# Patient Record
Sex: Female | Born: 1945 | Race: White | Hispanic: No | Marital: Married | Smoking: Never smoker
Health system: Southern US, Community
[De-identification: ages and names within clinical notes are randomized; demographics above are authoritative.]

## PROBLEM LIST (undated history)

## (undated) DIAGNOSIS — M1990 Unspecified osteoarthritis, unspecified site: Secondary | ICD-10-CM

## (undated) DIAGNOSIS — E079 Disorder of thyroid, unspecified: Secondary | ICD-10-CM

## (undated) HISTORY — PX: ABDOMINAL HYSTERECTOMY: SHX81

## (undated) HISTORY — PX: ANTERIOR AND POSTERIOR VAGINAL REPAIR: SUR5

## (undated) HISTORY — PX: TUBAL LIGATION: SHX77

---

## 1998-07-15 ENCOUNTER — Ambulatory Visit (HOSPITAL_COMMUNITY): Admission: RE | Admit: 1998-07-15 | Discharge: 1998-07-15 | Payer: Self-pay | Admitting: Gastroenterology

## 1999-06-23 ENCOUNTER — Other Ambulatory Visit: Admission: RE | Admit: 1999-06-23 | Discharge: 1999-06-23 | Payer: Self-pay | Admitting: Obstetrics and Gynecology

## 2000-08-29 ENCOUNTER — Other Ambulatory Visit: Admission: RE | Admit: 2000-08-29 | Discharge: 2000-08-29 | Payer: Self-pay | Admitting: Obstetrics and Gynecology

## 2001-04-29 ENCOUNTER — Ambulatory Visit (HOSPITAL_COMMUNITY): Admission: RE | Admit: 2001-04-29 | Discharge: 2001-04-29 | Payer: Self-pay | Admitting: Gastroenterology

## 2001-10-06 ENCOUNTER — Other Ambulatory Visit: Admission: RE | Admit: 2001-10-06 | Discharge: 2001-10-06 | Payer: Self-pay | Admitting: Obstetrics and Gynecology

## 2002-10-27 ENCOUNTER — Other Ambulatory Visit: Admission: RE | Admit: 2002-10-27 | Discharge: 2002-10-27 | Payer: Self-pay | Admitting: Obstetrics and Gynecology

## 2003-12-20 ENCOUNTER — Other Ambulatory Visit: Admission: RE | Admit: 2003-12-20 | Discharge: 2003-12-20 | Payer: Self-pay | Admitting: Obstetrics and Gynecology

## 2005-01-02 ENCOUNTER — Other Ambulatory Visit: Admission: RE | Admit: 2005-01-02 | Discharge: 2005-01-02 | Payer: Self-pay | Admitting: Obstetrics and Gynecology

## 2005-10-05 ENCOUNTER — Ambulatory Visit (HOSPITAL_COMMUNITY): Admission: RE | Admit: 2005-10-05 | Discharge: 2005-10-05 | Payer: Self-pay | Admitting: Obstetrics and Gynecology

## 2006-01-21 ENCOUNTER — Other Ambulatory Visit: Admission: RE | Admit: 2006-01-21 | Discharge: 2006-01-21 | Payer: Self-pay | Admitting: Obstetrics and Gynecology

## 2006-11-25 ENCOUNTER — Inpatient Hospital Stay (HOSPITAL_COMMUNITY): Admission: RE | Admit: 2006-11-25 | Discharge: 2006-11-28 | Payer: Self-pay | Admitting: Obstetrics and Gynecology

## 2011-12-25 DIAGNOSIS — R87620 Atypical squamous cells of undetermined significance on cytologic smear of vagina (ASC-US): Secondary | ICD-10-CM | POA: Diagnosis not present

## 2011-12-25 DIAGNOSIS — Z124 Encounter for screening for malignant neoplasm of cervix: Secondary | ICD-10-CM | POA: Diagnosis not present

## 2011-12-25 DIAGNOSIS — Z1231 Encounter for screening mammogram for malignant neoplasm of breast: Secondary | ICD-10-CM | POA: Diagnosis not present

## 2011-12-31 DIAGNOSIS — M810 Age-related osteoporosis without current pathological fracture: Secondary | ICD-10-CM | POA: Diagnosis not present

## 2011-12-31 DIAGNOSIS — Z23 Encounter for immunization: Secondary | ICD-10-CM | POA: Diagnosis not present

## 2011-12-31 DIAGNOSIS — Z136 Encounter for screening for cardiovascular disorders: Secondary | ICD-10-CM | POA: Diagnosis not present

## 2011-12-31 DIAGNOSIS — Z1329 Encounter for screening for other suspected endocrine disorder: Secondary | ICD-10-CM | POA: Diagnosis not present

## 2011-12-31 DIAGNOSIS — Z0000 Encounter for general adult medical examination without abnormal findings: Secondary | ICD-10-CM | POA: Diagnosis not present

## 2012-01-14 DIAGNOSIS — R946 Abnormal results of thyroid function studies: Secondary | ICD-10-CM | POA: Diagnosis not present

## 2012-01-14 DIAGNOSIS — R7989 Other specified abnormal findings of blood chemistry: Secondary | ICD-10-CM | POA: Diagnosis not present

## 2012-01-14 DIAGNOSIS — Z1211 Encounter for screening for malignant neoplasm of colon: Secondary | ICD-10-CM | POA: Diagnosis not present

## 2012-02-18 DIAGNOSIS — M810 Age-related osteoporosis without current pathological fracture: Secondary | ICD-10-CM | POA: Diagnosis not present

## 2012-02-18 DIAGNOSIS — Z13820 Encounter for screening for osteoporosis: Secondary | ICD-10-CM | POA: Diagnosis not present

## 2012-05-12 DIAGNOSIS — H43819 Vitreous degeneration, unspecified eye: Secondary | ICD-10-CM | POA: Diagnosis not present

## 2012-05-13 DIAGNOSIS — R946 Abnormal results of thyroid function studies: Secondary | ICD-10-CM | POA: Diagnosis not present

## 2012-07-22 DIAGNOSIS — E039 Hypothyroidism, unspecified: Secondary | ICD-10-CM | POA: Diagnosis not present

## 2012-12-29 DIAGNOSIS — Z9189 Other specified personal risk factors, not elsewhere classified: Secondary | ICD-10-CM | POA: Diagnosis not present

## 2012-12-29 DIAGNOSIS — Z1231 Encounter for screening mammogram for malignant neoplasm of breast: Secondary | ICD-10-CM | POA: Diagnosis not present

## 2012-12-29 DIAGNOSIS — Z130 Encounter for screening for diseases of the blood and blood-forming organs and certain disorders involving the immune mechanism: Secondary | ICD-10-CM | POA: Diagnosis not present

## 2012-12-29 DIAGNOSIS — Z124 Encounter for screening for malignant neoplasm of cervix: Secondary | ICD-10-CM | POA: Diagnosis not present

## 2013-01-13 DIAGNOSIS — Z0000 Encounter for general adult medical examination without abnormal findings: Secondary | ICD-10-CM | POA: Diagnosis not present

## 2013-01-13 DIAGNOSIS — E039 Hypothyroidism, unspecified: Secondary | ICD-10-CM | POA: Diagnosis not present

## 2013-03-10 DIAGNOSIS — M25569 Pain in unspecified knee: Secondary | ICD-10-CM | POA: Diagnosis not present

## 2013-07-21 DIAGNOSIS — E039 Hypothyroidism, unspecified: Secondary | ICD-10-CM | POA: Diagnosis not present

## 2013-09-30 DIAGNOSIS — E039 Hypothyroidism, unspecified: Secondary | ICD-10-CM | POA: Diagnosis not present

## 2014-01-18 DIAGNOSIS — Z0000 Encounter for general adult medical examination without abnormal findings: Secondary | ICD-10-CM | POA: Diagnosis not present

## 2014-01-18 DIAGNOSIS — Z23 Encounter for immunization: Secondary | ICD-10-CM | POA: Diagnosis not present

## 2014-01-18 DIAGNOSIS — Z136 Encounter for screening for cardiovascular disorders: Secondary | ICD-10-CM | POA: Diagnosis not present

## 2014-01-18 DIAGNOSIS — E039 Hypothyroidism, unspecified: Secondary | ICD-10-CM | POA: Diagnosis not present

## 2014-02-01 DIAGNOSIS — Z124 Encounter for screening for malignant neoplasm of cervix: Secondary | ICD-10-CM | POA: Diagnosis not present

## 2014-02-01 DIAGNOSIS — Z9189 Other specified personal risk factors, not elsewhere classified: Secondary | ICD-10-CM | POA: Diagnosis not present

## 2014-02-01 DIAGNOSIS — Z1231 Encounter for screening mammogram for malignant neoplasm of breast: Secondary | ICD-10-CM | POA: Diagnosis not present

## 2014-02-01 DIAGNOSIS — Z1212 Encounter for screening for malignant neoplasm of rectum: Secondary | ICD-10-CM | POA: Diagnosis not present

## 2014-05-31 DIAGNOSIS — D126 Benign neoplasm of colon, unspecified: Secondary | ICD-10-CM | POA: Diagnosis not present

## 2014-05-31 DIAGNOSIS — Z85038 Personal history of other malignant neoplasm of large intestine: Secondary | ICD-10-CM | POA: Diagnosis not present

## 2014-05-31 DIAGNOSIS — Z800 Family history of malignant neoplasm of digestive organs: Secondary | ICD-10-CM | POA: Diagnosis not present

## 2014-05-31 DIAGNOSIS — Z8371 Family history of colonic polyps: Secondary | ICD-10-CM | POA: Diagnosis not present

## 2014-05-31 DIAGNOSIS — Z86010 Personal history of colonic polyps: Secondary | ICD-10-CM | POA: Diagnosis not present

## 2014-06-04 ENCOUNTER — Other Ambulatory Visit: Payer: Self-pay

## 2014-06-04 DIAGNOSIS — L820 Inflamed seborrheic keratosis: Secondary | ICD-10-CM | POA: Diagnosis not present

## 2014-06-04 DIAGNOSIS — D485 Neoplasm of uncertain behavior of skin: Secondary | ICD-10-CM | POA: Diagnosis not present

## 2014-06-04 DIAGNOSIS — D1801 Hemangioma of skin and subcutaneous tissue: Secondary | ICD-10-CM | POA: Diagnosis not present

## 2014-06-04 DIAGNOSIS — D235 Other benign neoplasm of skin of trunk: Secondary | ICD-10-CM | POA: Diagnosis not present

## 2014-06-04 DIAGNOSIS — L819 Disorder of pigmentation, unspecified: Secondary | ICD-10-CM | POA: Diagnosis not present

## 2014-06-14 DIAGNOSIS — H43819 Vitreous degeneration, unspecified eye: Secondary | ICD-10-CM | POA: Diagnosis not present

## 2014-07-20 DIAGNOSIS — E039 Hypothyroidism, unspecified: Secondary | ICD-10-CM | POA: Diagnosis not present

## 2014-08-27 DIAGNOSIS — Z23 Encounter for immunization: Secondary | ICD-10-CM | POA: Diagnosis not present

## 2015-01-24 DIAGNOSIS — Z13220 Encounter for screening for lipoid disorders: Secondary | ICD-10-CM | POA: Diagnosis not present

## 2015-01-24 DIAGNOSIS — H43812 Vitreous degeneration, left eye: Secondary | ICD-10-CM | POA: Diagnosis not present

## 2015-01-24 DIAGNOSIS — E039 Hypothyroidism, unspecified: Secondary | ICD-10-CM | POA: Diagnosis not present

## 2015-01-24 DIAGNOSIS — Z0000 Encounter for general adult medical examination without abnormal findings: Secondary | ICD-10-CM | POA: Diagnosis not present

## 2015-01-25 DIAGNOSIS — H43812 Vitreous degeneration, left eye: Secondary | ICD-10-CM | POA: Diagnosis not present

## 2015-02-07 DIAGNOSIS — H43812 Vitreous degeneration, left eye: Secondary | ICD-10-CM | POA: Diagnosis not present

## 2015-02-21 ENCOUNTER — Other Ambulatory Visit: Payer: Self-pay | Admitting: Obstetrics and Gynecology

## 2015-02-21 DIAGNOSIS — Z1231 Encounter for screening mammogram for malignant neoplasm of breast: Secondary | ICD-10-CM | POA: Diagnosis not present

## 2015-02-21 DIAGNOSIS — Z01419 Encounter for gynecological examination (general) (routine) without abnormal findings: Secondary | ICD-10-CM | POA: Diagnosis not present

## 2015-02-21 DIAGNOSIS — Z681 Body mass index (BMI) 19 or less, adult: Secondary | ICD-10-CM | POA: Diagnosis not present

## 2015-02-24 LAB — CYTOLOGY - PAP

## 2015-03-29 DIAGNOSIS — E039 Hypothyroidism, unspecified: Secondary | ICD-10-CM | POA: Diagnosis not present

## 2015-05-30 ENCOUNTER — Other Ambulatory Visit: Payer: Self-pay | Admitting: Obstetrics and Gynecology

## 2015-05-30 DIAGNOSIS — Z779 Other contact with and (suspected) exposures hazardous to health: Secondary | ICD-10-CM | POA: Diagnosis not present

## 2015-05-31 LAB — CYTOLOGY - PAP

## 2015-08-01 DIAGNOSIS — Z23 Encounter for immunization: Secondary | ICD-10-CM | POA: Diagnosis not present

## 2015-08-01 DIAGNOSIS — E039 Hypothyroidism, unspecified: Secondary | ICD-10-CM | POA: Diagnosis not present

## 2015-10-12 DIAGNOSIS — K625 Hemorrhage of anus and rectum: Secondary | ICD-10-CM | POA: Diagnosis not present

## 2016-03-13 DIAGNOSIS — Z1231 Encounter for screening mammogram for malignant neoplasm of breast: Secondary | ICD-10-CM | POA: Diagnosis not present

## 2019-07-09 ENCOUNTER — Other Ambulatory Visit: Payer: Self-pay | Admitting: Obstetrics and Gynecology

## 2019-07-09 DIAGNOSIS — R928 Other abnormal and inconclusive findings on diagnostic imaging of breast: Secondary | ICD-10-CM

## 2019-07-20 ENCOUNTER — Ambulatory Visit
Admission: RE | Admit: 2019-07-20 | Discharge: 2019-07-20 | Disposition: A | Discharge status: Home / Self Care | Payer: Medicare Other | Source: Ambulatory Visit | Attending: Obstetrics and Gynecology | Admitting: Obstetrics and Gynecology

## 2019-07-20 ENCOUNTER — Other Ambulatory Visit: Payer: Self-pay | Admitting: Obstetrics and Gynecology

## 2019-07-20 ENCOUNTER — Other Ambulatory Visit: Payer: Self-pay

## 2019-07-20 DIAGNOSIS — R928 Other abnormal and inconclusive findings on diagnostic imaging of breast: Secondary | ICD-10-CM

## 2019-07-20 DIAGNOSIS — N6320 Unspecified lump in the left breast, unspecified quadrant: Secondary | ICD-10-CM

## 2019-07-28 ENCOUNTER — Other Ambulatory Visit: Payer: Self-pay | Admitting: Obstetrics and Gynecology

## 2019-07-28 ENCOUNTER — Ambulatory Visit
Admission: RE | Admit: 2019-07-28 | Discharge: 2019-07-28 | Disposition: A | Discharge status: Home / Self Care | Payer: Medicare Other | Source: Ambulatory Visit | Attending: Obstetrics and Gynecology | Admitting: Obstetrics and Gynecology

## 2019-07-28 ENCOUNTER — Other Ambulatory Visit: Payer: Self-pay

## 2019-07-28 DIAGNOSIS — N6320 Unspecified lump in the left breast, unspecified quadrant: Secondary | ICD-10-CM

## 2019-08-03 ENCOUNTER — Other Ambulatory Visit: Payer: Self-pay

## 2019-08-03 ENCOUNTER — Ambulatory Visit
Admission: RE | Admit: 2019-08-03 | Discharge: 2019-08-03 | Disposition: A | Discharge status: Home / Self Care | Payer: Medicare Other | Source: Ambulatory Visit | Attending: Obstetrics and Gynecology | Admitting: Obstetrics and Gynecology

## 2019-08-03 DIAGNOSIS — N6320 Unspecified lump in the left breast, unspecified quadrant: Secondary | ICD-10-CM

## 2020-08-16 DIAGNOSIS — Z90710 Acquired absence of both cervix and uterus: Secondary | ICD-10-CM | POA: Insufficient documentation

## 2020-08-31 DIAGNOSIS — M810 Age-related osteoporosis without current pathological fracture: Secondary | ICD-10-CM | POA: Insufficient documentation

## 2020-12-02 ENCOUNTER — Other Ambulatory Visit: Payer: Self-pay

## 2020-12-02 ENCOUNTER — Emergency Department (HOSPITAL_BASED_OUTPATIENT_CLINIC_OR_DEPARTMENT_OTHER): Payer: Medicare Other

## 2020-12-02 ENCOUNTER — Encounter (HOSPITAL_BASED_OUTPATIENT_CLINIC_OR_DEPARTMENT_OTHER): Payer: Self-pay | Admitting: Emergency Medicine

## 2020-12-02 ENCOUNTER — Emergency Department (HOSPITAL_BASED_OUTPATIENT_CLINIC_OR_DEPARTMENT_OTHER)
Admission: EM | Admit: 2020-12-02 | Discharge: 2020-12-03 | Disposition: A | Discharge status: Home / Self Care | Payer: Medicare Other | Attending: Emergency Medicine | Admitting: Emergency Medicine

## 2020-12-02 DIAGNOSIS — K5904 Chronic idiopathic constipation: Secondary | ICD-10-CM

## 2020-12-02 DIAGNOSIS — S32591A Other specified fracture of right pubis, initial encounter for closed fracture: Secondary | ICD-10-CM | POA: Insufficient documentation

## 2020-12-02 DIAGNOSIS — S79911A Unspecified injury of right hip, initial encounter: Secondary | ICD-10-CM | POA: Diagnosis present

## 2020-12-02 DIAGNOSIS — W19XXXA Unspecified fall, initial encounter: Secondary | ICD-10-CM

## 2020-12-02 DIAGNOSIS — Y92410 Unspecified street and highway as the place of occurrence of the external cause: Secondary | ICD-10-CM | POA: Insufficient documentation

## 2020-12-02 DIAGNOSIS — W010XXA Fall on same level from slipping, tripping and stumbling without subsequent striking against object, initial encounter: Secondary | ICD-10-CM | POA: Insufficient documentation

## 2020-12-02 DIAGNOSIS — Y9301 Activity, walking, marching and hiking: Secondary | ICD-10-CM | POA: Diagnosis not present

## 2020-12-02 HISTORY — DX: Disorder of thyroid, unspecified: E07.9

## 2020-12-02 HISTORY — DX: Unspecified osteoarthritis, unspecified site: M19.90

## 2020-12-02 NOTE — ED Notes (Addendum)
Imaging on hold momentarily- pt brought images on a cd from earlier today.  Claiborne Billings Agricultural consultant to confirm.

## 2020-12-02 NOTE — ED Provider Notes (Signed)
[facility] Provider Note: Georgena Spurling, MD, FACEP  CSN: [id number] MRN: [id number] ARRIVAL: 12/02/20 at 2016 ROOM: [place]  Hip Injury   HISTORY OF PRESENT ILLNESS  12/02/20 11:28 PM Vicki Barker is a 75 y.o. female who was walking on the street yesterday evening and tripped over some brush she did not see.  She fell onto her right side and is having pain in her right hip and inner thigh.  She rates the pain as an 8 out of 10, worse with movement or weightbearing.  She was seen in [facility] walk-in clinic yesterday and plain films showed pubic rami fractures.  She was sent to the ED for further evaluation.   Past Medical History:  Diagnosis Date  . Arthritis   . Thyroid disease     Past Surgical History:  Procedure Laterality Date  . ABDOMINAL HYSTERECTOMY    . ANTERIOR AND POSTERIOR VAGINAL REPAIR    . TUBAL LIGATION      No family history on file.  Social History   Tobacco Use  . Smoking status: Never Smoker  . Smokeless tobacco: Never Used  Vaping Use  . Vaping Use: Never used  Substance Use Topics  . Alcohol use: Never  . Drug use: Never    Prior to Admission medications   Medication Sig Start Date End Date Taking? Authorizing Provider  levothyroxine (SYNTHROID) 50 MCG tablet Take 50 mcg by mouth daily. 09/21/20   [provider]    Allergies Penicillins and Sulfa antibiotics   REVIEW OF SYSTEMS  Negative except as noted here or in the History of Present Illness.   PHYSICAL EXAMINATION  Initial Vital Signs Blood pressure (!) 156/85, pulse 95, temperature 98.7 F (37.1 C), temperature source Oral, resp. rate 18, height 5' 3.5" (1.613 m), weight 45.8 kg, SpO2 99 %.  Examination General: Well-developed, well-nourished female in no acute distress; appearance consistent with age of record HENT: normocephalic; atraumatic Eyes: pupils equal, round and reactive to light; extraocular muscles intact Neck: supple;  nontender Heart: regular rate and rhythm Lungs: clear to auscultation bilaterally Abdomen: soft; nondistended; right pubic bone tenderness; bowel sounds present Extremities: No deformity; full range of motion; pulses normal Neurologic: Awake, alert and oriented; motor function intact in all extremities and symmetric; no facial droop Skin: Warm and dry Psychiatric: Normal mood and affect   RESULTS  Summary of this visit's results, reviewed and interpreted by myself:   EKG Interpretation  Date/Time:    Ventricular Rate:    PR Interval:    QRS Duration:   QT Interval:    QTC Calculation:   R Axis:     Text Interpretation:        Laboratory Studies: No results found for this or any previous visit (from the past 24 hour(s)). Imaging Studies: CT PELVIS WO CONTRAST  Result Date: 12/02/2020 CLINICAL DATA:  Pelvic pain.  Pain in the inner right thigh. EXAM: CT PELVIS WITHOUT CONTRAST TECHNIQUE: Multidetector CT imaging of the pelvis was performed following the standard protocol without intravenous contrast. COMPARISON:  None. FINDINGS: Urinary Tract:  The urinary bladder is significantly distended. Bowel: There is a large amount of stool throughout the colon. There appears to be fecalization of multiple small bowel loops. Vascular/Lymphatic: Atherosclerotic changes are noted of the distal abdominal aorta. There are no definite pathologically enlarged lymph nodes. Reproductive: No mass or other significant abnormality the patient appears to be status post prior hysterectomy. Other: There is soft  tissue swelling overlying the right hip laterally. Musculoskeletal: There is acute nondisplaced fracture of the right inferior pubic ramus. There is an acute nondisplaced fracture of the parasymphyseal right pubic bone. There is no evidence for right hip fracture. There are degenerative changes of the lumbar spine. IMPRESSION: 1. Acute nondisplaced fractures of the right inferior pubic ramus and  parasymphyseal right pubic bone. 2. No evidence for right hip fracture. 3. Soft tissue swelling overlying the right hip laterally. 4. Large amount of stool throughout the colon with fecalization of multiple small bowel loops. Findings are suggestive of constipation. 5. Distended urinary bladder. Aortic Atherosclerosis (ICD10-I70.0). Electronically Signed   By: Constance Holster M.D.   On: 12/02/2020 23:40    ED COURSE and MDM  Nursing notes, initial and subsequent vitals signs, including pulse oximetry, reviewed and interpreted by myself.  Vitals:   12/02/20 2027 12/02/20 2028 12/02/20 2300 12/02/20 2318  BP: (!) 177/85  (!) 168/84 (!) 156/85  Pulse: (!) 108  (!) 104 95  Resp: 20  20 18   Temp: 98.7 F (37.1 C)  98.7 F (37.1 C)   TempSrc: Oral  Oral   SpO2: 100%  100% 99%  Weight:  45.8 kg    Height:  5' 3.5" (1.613 m)     Medications - No data to display  The patient's fractures do not involve her right hip or acetabulum.  I believe it is safe for her to be weightbearing as tolerated.  She was advised she will likely need a walker which she can get from a medical supply store.  We will refer to orthopedics for long-term management.  The patient is aware of constipation and if she is on fiber supplements for this.  She does not wish to take any pain medications other than Tylenol.  PROCEDURES  Procedures   ED DIAGNOSES     ICD-10-CM   1. Inferior pubic ramus fracture, right, closed, initial encounter ([place])  S32.591A   2. Fall, initial encounter  W19.XXXA   3. Chronic idiopathic constipation  K59.04        Traci Gafford, Jenny Reichmann, MD 12/03/20 0006

## 2020-12-02 NOTE — ED Triage Notes (Signed)
Pt was walking last night and did not see some brush on road. Tripped and fell on street. Seen at [facility] walk in, has paperwork and DVD. Pain in inner thigh right leg

## 2020-12-03 MED ORDER — ACETAMINOPHEN-CODEINE #3 300-30 MG PO TABS: 1.0000 | ORAL_TABLET | ORAL | 0 refills | Status: DC | PRN

## 2020-12-27 IMAGING — MG MM DIGITAL DIAGNOSTIC UNILAT*L* W/ TOMO W/ CAD
8 series · 9 of 24 positions shown · non-contrast
Comparison: Previous exam(s).

CLINICAL DATA: Patient recalled from screening for left breast
asymmetry.

EXAM:
DIGITAL DIAGNOSTIC LEFT MAMMOGRAM WITH CAD AND TOMO
ULTRASOUND LEFT BREAST

[L CC synth-2D (1 of 2)]
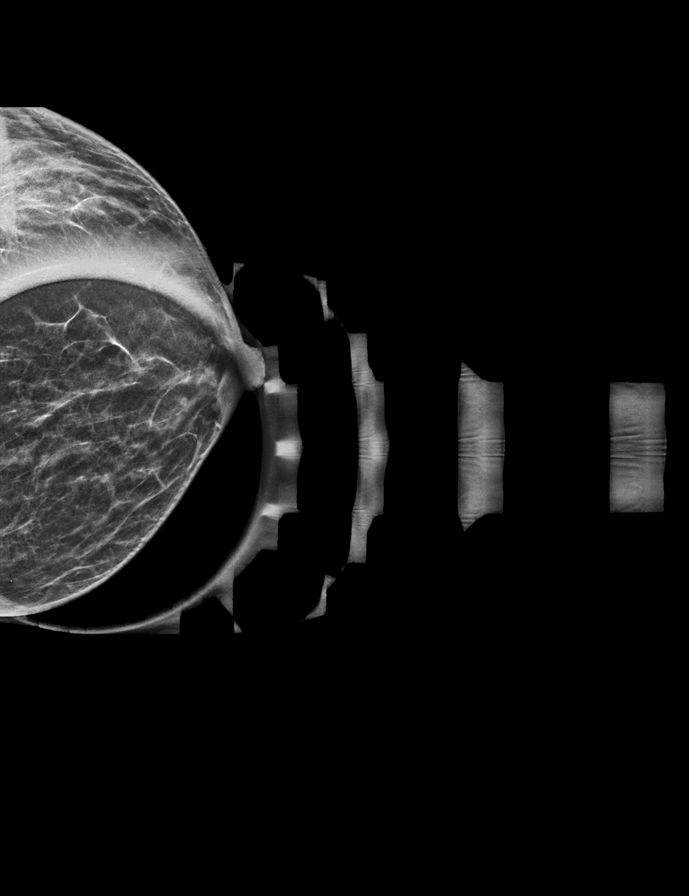

[L ML synth-2D]
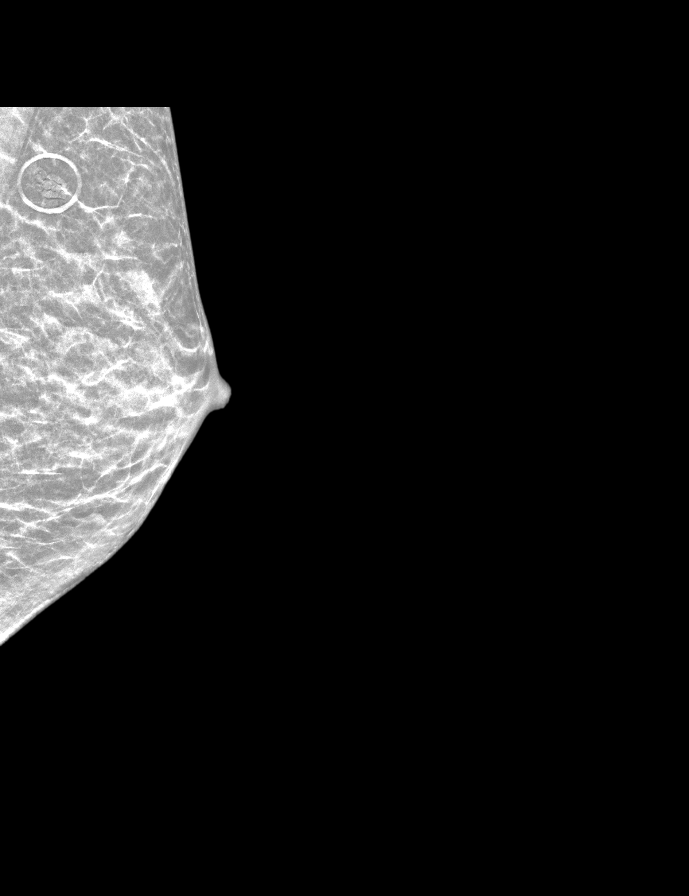

[L CC synth-2D (2 of 2)]
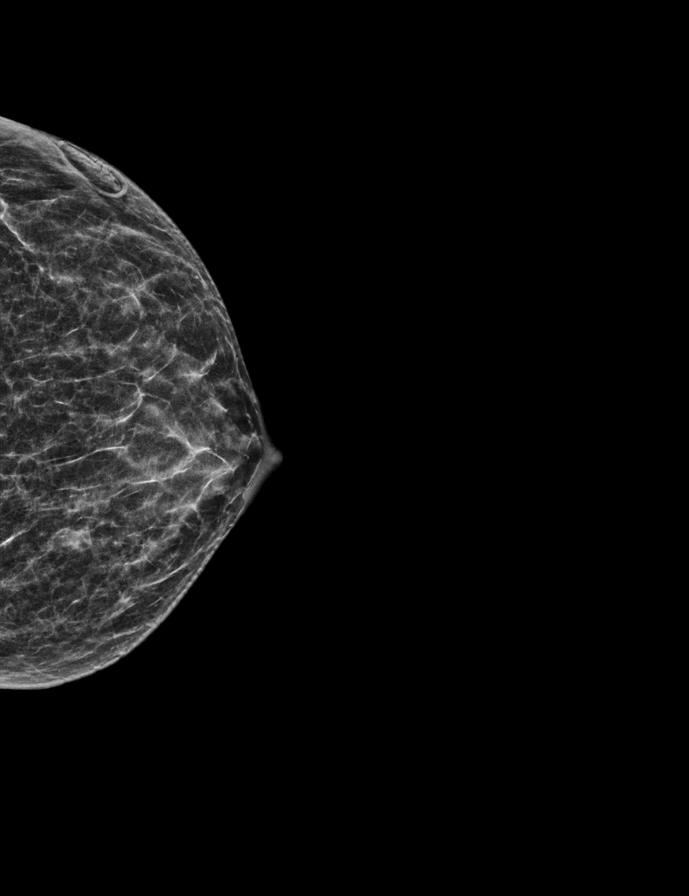

[L MLO synth-2D]
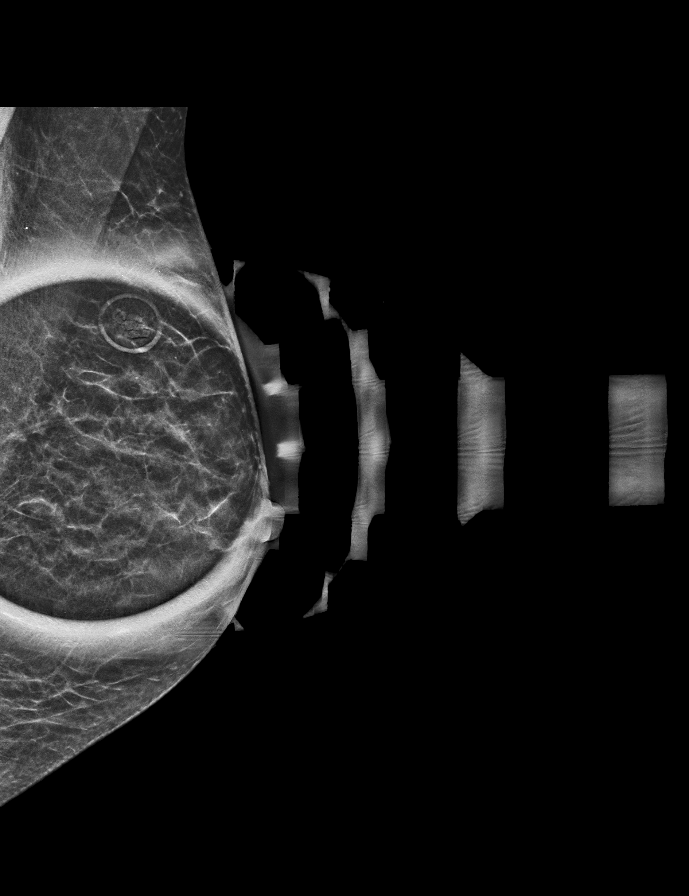

[L ML tomo · 2 of 36 frames shown]
[frame 12/36]
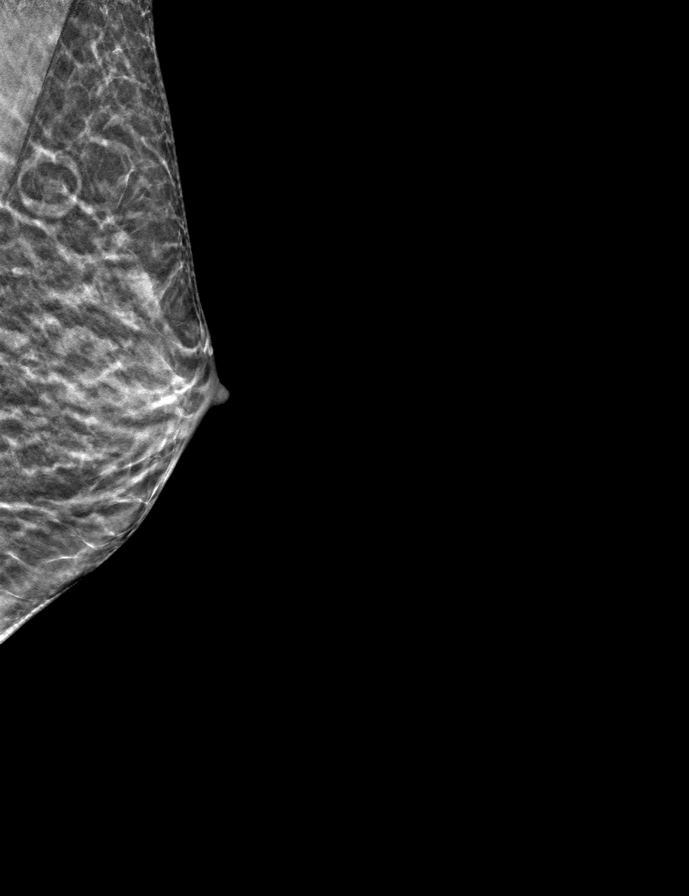
[frame 19/36]
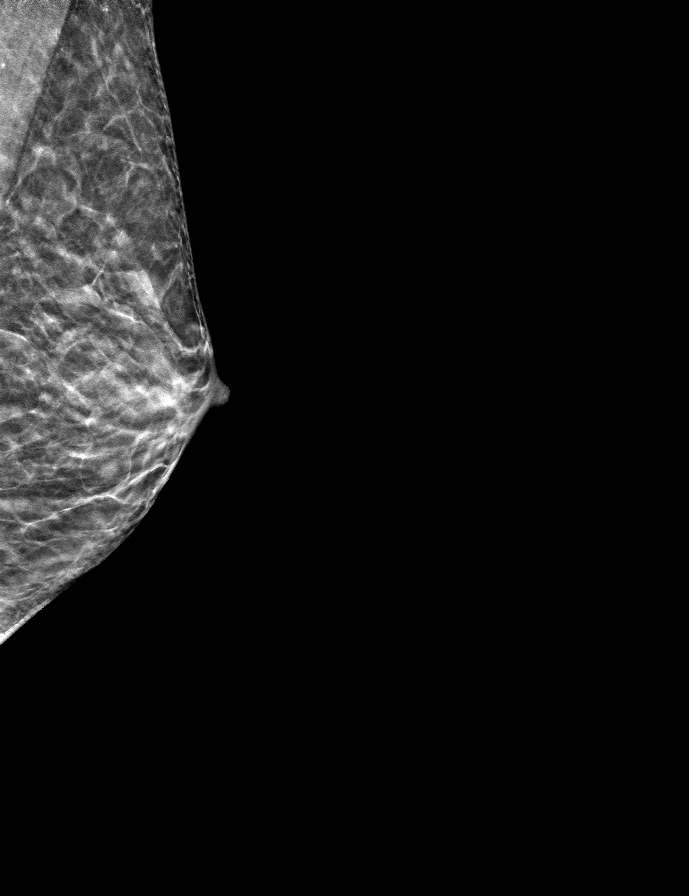

[L MLO tomo · tomo slice 19/37.0]
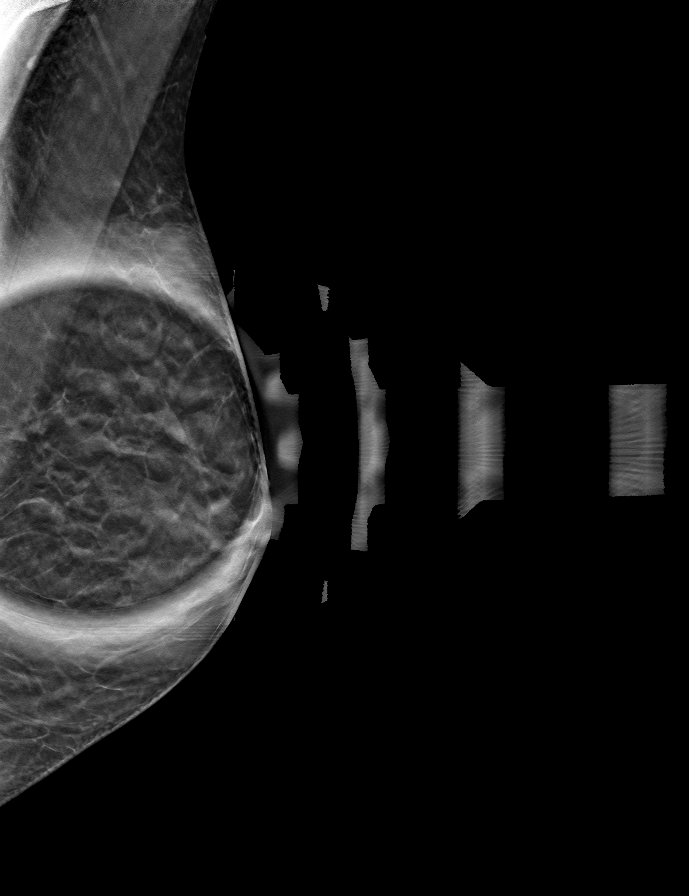

[L CC tomo (1 of 2) · tomo slice 20/39.0]
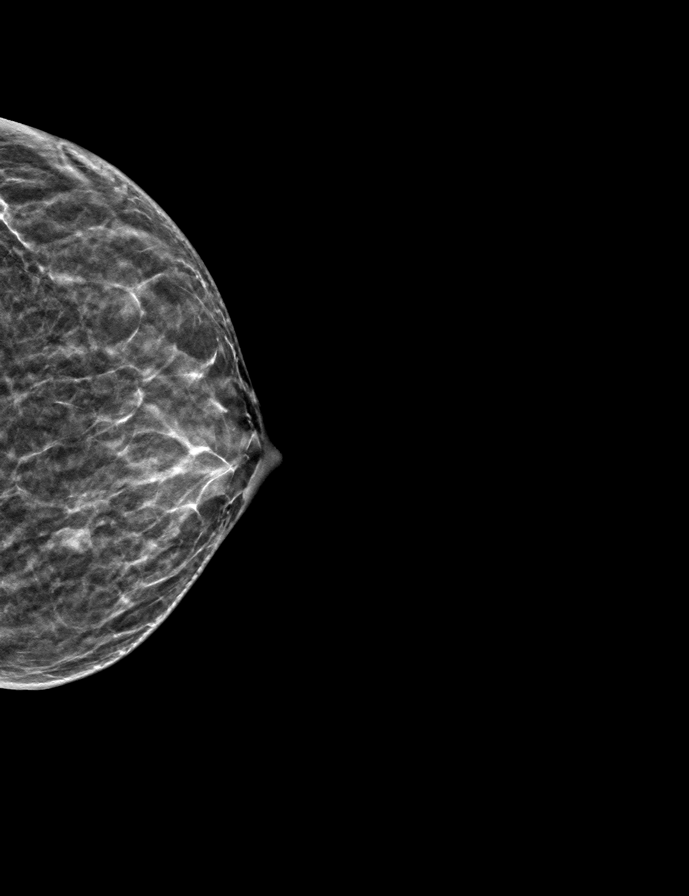

[L CC tomo (2 of 2) · tomo slice 19/38.0]
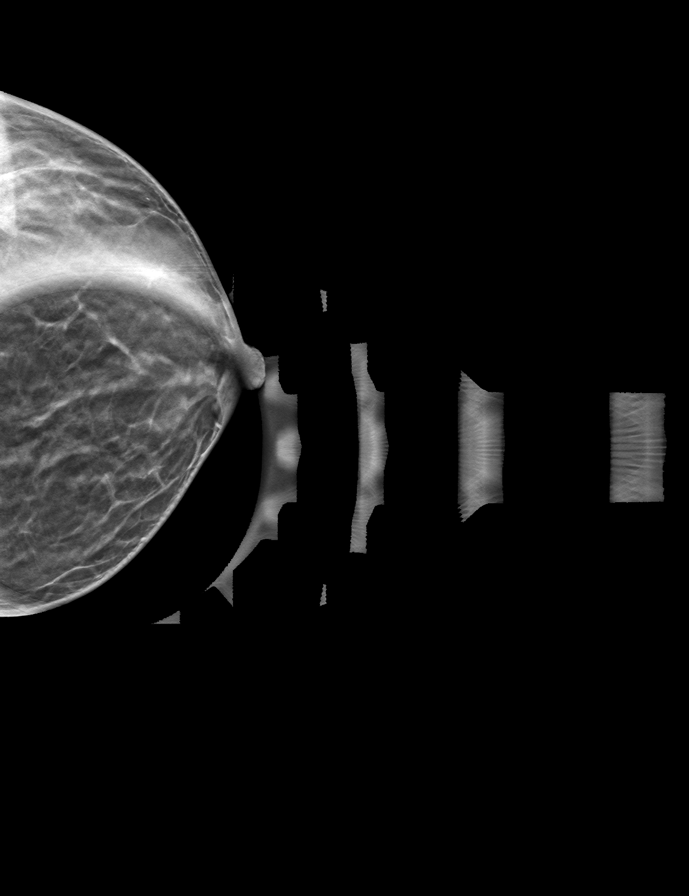

[9 of 24 positions shown; findings below may reference images not displayed]

ACR Breast Density Category c: The breast tissue is heterogeneously
dense, which may obscure small masses.
FINDINGS: There is a persistent asymmetry demonstrated within the medial
aspect of the left breast middle depth, demonstrated best on the CC
view.

Mammographic images were processed with CAD.

On physical exam, no discrete mass is palpated within the medial
left breast.

Targeted ultrasound is performed, showing a 6 x 3 x 7 mm lobular
hypoechoic solid and cystic mass left breast 10 o'clock position 2
cm from nipple. No left axillary adenopathy.
IMPRESSION: Indeterminate solid and cystic mass left breast 10 o'clock position,
felt to correspond with mammographically identified asymmetry.

RECOMMENDATION:
Ultrasound-guided core needle biopsy left breast mass 10 o'clock
position.

I have discussed the findings and recommendations with the patient.
Results were also provided in writing at the conclusion of the
visit. If applicable, a reminder letter will be sent to the patient
regarding the next appointment.

BI-RADS CATEGORY  4: Suspicious.

## 2022-01-17 DIAGNOSIS — E039 Hypothyroidism, unspecified: Secondary | ICD-10-CM | POA: Insufficient documentation

## 2022-01-17 DIAGNOSIS — Z860101 Personal history of adenomatous and serrated colon polyps: Secondary | ICD-10-CM | POA: Insufficient documentation

## 2024-05-28 ENCOUNTER — Encounter: Payer: Self-pay | Admitting: Oncology

## 2024-05-28 ENCOUNTER — Inpatient Hospital Stay: Attending: Oncology | Admitting: Oncology

## 2024-05-28 ENCOUNTER — Inpatient Hospital Stay

## 2024-05-28 VITALS — BP 135/71 | HR 91 | Temp 98.7°F | Resp 17 | Ht 62.0 in | Wt 99.7 lb

## 2024-05-28 DIAGNOSIS — M810 Age-related osteoporosis without current pathological fracture: Secondary | ICD-10-CM | POA: Insufficient documentation

## 2024-05-28 DIAGNOSIS — D709 Neutropenia, unspecified: Secondary | ICD-10-CM

## 2024-05-28 DIAGNOSIS — Z880 Allergy status to penicillin: Secondary | ICD-10-CM | POA: Diagnosis not present

## 2024-05-28 DIAGNOSIS — Z882 Allergy status to sulfonamides status: Secondary | ICD-10-CM | POA: Diagnosis not present

## 2024-05-28 DIAGNOSIS — Z79890 Hormone replacement therapy (postmenopausal): Secondary | ICD-10-CM | POA: Insufficient documentation

## 2024-05-28 DIAGNOSIS — M19049 Primary osteoarthritis, unspecified hand: Secondary | ICD-10-CM | POA: Insufficient documentation

## 2024-05-28 DIAGNOSIS — D696 Thrombocytopenia, unspecified: Secondary | ICD-10-CM

## 2024-05-28 DIAGNOSIS — E039 Hypothyroidism, unspecified: Secondary | ICD-10-CM | POA: Diagnosis not present

## 2024-05-28 DIAGNOSIS — M1990 Unspecified osteoarthritis, unspecified site: Secondary | ICD-10-CM

## 2024-05-28 LAB — CMP (CANCER CENTER ONLY)
ALT: 10 U/L (ref 0–44)
AST: 13 U/L — ABNORMAL LOW (ref 15–41)
Albumin: 4.2 g/dL (ref 3.5–5.0)
Alkaline Phosphatase: 59 U/L (ref 38–126)
Anion gap: 3 — ABNORMAL LOW (ref 5–15)
BUN: 15 mg/dL (ref 8–23)
CO2: 33 mmol/L — ABNORMAL HIGH (ref 22–32)
Calcium: 9.8 mg/dL (ref 8.9–10.3)
Chloride: 102 mmol/L (ref 98–111)
Creatinine: 0.81 mg/dL (ref 0.44–1.00)
GFR, Estimated: >60 mL/min (ref >60)
Glucose, Bld: 117 mg/dL — ABNORMAL HIGH (ref 70–99)
Potassium: 5 mmol/L (ref 3.5–5.1)
Sodium: 138 mmol/L (ref 135–145)
Total Bilirubin: 0.4 mg/dL (ref 0.0–1.2)
Total Protein: 6.9 g/dL (ref 6.5–8.1)

## 2024-05-28 LAB — CBC WITH DIFFERENTIAL (CANCER CENTER ONLY)
Abs Immature Granulocytes: 0.01 K/uL (ref 0.00–0.07)
Basophils Absolute: 0 K/uL (ref 0.0–0.1)
Basophils Relative: 0 %
Eosinophils Absolute: 0 K/uL (ref 0.0–0.5)
Eosinophils Relative: 0 %
HCT: 37 % (ref 36.0–46.0)
Hemoglobin: 12.5 g/dL (ref 12.0–15.0)
Immature Granulocytes: 0 %
Lymphocytes Relative: 39 %
Lymphs Abs: 1.3 K/uL (ref 0.7–4.0)
MCH: 30.3 pg (ref 26.0–34.0)
MCHC: 33.8 g/dL (ref 30.0–36.0)
MCV: 89.6 fL (ref 80.0–100.0)
Monocytes Absolute: 0.8 K/uL (ref 0.1–1.0)
Monocytes Relative: 24 %
Neutro Abs: 1.2 K/uL — ABNORMAL LOW (ref 1.7–7.7)
Neutrophils Relative %: 37 %
Platelet Count: 113 K/uL — ABNORMAL LOW (ref 150–400)
RBC: 4.13 MIL/uL (ref 3.87–5.11)
RDW: 13.9 % (ref 11.5–15.5)
WBC Count: 3.3 K/uL — ABNORMAL LOW (ref 4.0–10.5)
nRBC: 0 % (ref 0.0–0.2)

## 2024-05-28 LAB — IMMATURE PLATELET FRACTION: Immature Platelet Fraction: 9.5 % — ABNORMAL HIGH (ref 1.2–8.6)

## 2024-05-28 LAB — FOLATE: Folate: 28.1 ng/mL (ref >5.9)

## 2024-05-28 LAB — VITAMIN B12: Vitamin B-12: 718 pg/mL (ref 180–914)

## 2024-05-28 LAB — LACTATE DEHYDROGENASE: LDH: 185 U/L (ref 98–192)

## 2024-05-28 NOTE — Assessment & Plan Note (Addendum)
 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 Neutrophil count remains at a safe level of 1,100, indicating preserved immunity. Differential diagnosis includes chronic leukemia, myelodysplastic syndrome (MDS), and autoimmune destruction of cells. Low suspicion for leukemia, MDS is a possibility but considered slim. No symptoms of infection or bleeding, suggesting adequate cell function.  Labs today revealed white count of 3300 with ANC of 1200, ALC of 1300, normal differential.  Morphology unremarkable.  Hemoglobin 12.5, platelet 113,000.  CMP unremarkable.  B12, folate are within normal limits.  LDH normal.  Immature platelet fraction increased at 9.5%, indicating adequate bone marrow response for thrombocytopenia.  Iron studies, copper , ANA, rheumatoid factor, flow cytometry of peripheral blood, BCR/ABL 1 testing was pursued.  We will follow-up on the results and discuss with patient, once results available.  - Arrange a phone call visit next week to discuss results. - Plan to see her again in two months to monitor blood counts.

## 2024-05-28 NOTE — Progress Notes (Signed)
 [facility]  HEMATOLOGY CLINIC CONSULTATION NOTE   PATIENT NAME: Vicki Barker   MR#: [id number] DOB: August 05, 1946  DATE OF SERVICE: 05/28/2024   REFERRING PROVIDER  Rolinda, Vernell, MD   Patient Care Team: Rolinda Vernell, MD as PCP - General (Family Medicine)   REASON FOR CONSULTATION/ CHIEF COMPLAINT:  Leukopenia and thrombocytopenia  ASSESSMENT & PLAN:  Vicki Barker is a 78 y.o. lady with a past medical history of osteoporosis, hypothyroidism, was referred to our service for evaluation of leukopenia and thrombocytopenia.    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 Neutrophil count remains at a safe level of 1,100, indicating preserved immunity. Differential diagnosis includes chronic leukemia, myelodysplastic syndrome (MDS), and autoimmune destruction of cells. Low suspicion for leukemia, MDS is a possibility but considered slim. No symptoms of infection or bleeding, suggesting adequate cell function.  Labs today revealed white count of 3300 with ANC of 1200, ALC of 1300, normal differential.  Morphology unremarkable.  Hemoglobin 12.5, platelet 113,000.  CMP unremarkable.  B12, folate are within normal limits.  LDH normal.  Immature platelet fraction increased at 9.5%, indicating adequate bone marrow response for thrombocytopenia.  Iron studies, copper , ANA, rheumatoid factor, flow cytometry of peripheral blood, BCR/ABL 1 testing was pursued.  We will follow-up on the results and discuss with patient, once results available.  - Arrange a phone call visit next week to discuss results. - Plan to see her again in two months to monitor blood counts.  Thrombocytopenia (HCC) Chronic thrombocytopenia with a platelet count of 100, decreased from 144 in May 2020. No symptoms of bleeding such as epistaxis or gum bleeding. Differential diagnosis includes autoimmune destruction of platelets and MDS.  Platelet count is above the threshold for treatment with steroids, which would be considered if platelets fall below 30,000.  - Monitor platelet count and assess for any signs of bleeding.  - Platelet count stable at 113,000 today.  Immature platelet fraction is increased at 9.5%, indicating adequate bone marrow response to thrombocytopenia.   Osteoporosis Osteoporosis managed with Prolia for the past five to six years. Last injection was in May.  Hypothyroidism Hypothyroidism managed with thyroid  medication. Recent blood work showed improved thyroid  function compared to last year.  Arthritis Mild arthritis with occasional pain in fingers, not requiring regular medication. No significant impact on daily activities.  Follow-up Plan to follow up on blood test results and monitor her condition over time. - Arrange a phone call visit next week to discuss blood test results. - Schedule an in-person follow-up appointment in two months.  I reviewed lab results and outside records for this visit and discussed relevant results with the patient. Diagnosis, plan of care and treatment options were also discussed in detail with the patient. Opportunity provided to ask questions and answers provided to her apparent satisfaction. Provided instructions to call our clinic with any problems, questions or concerns prior to return visit. I recommended to continue follow-up with PCP and sub-specialists. She verbalized understanding and agreed with the plan. No barriers to learning was detected.  [name], MD  05/28/2024 2:19 PM  [facility] - A DEPT OF [facility] [address] [place] [address] Dept: [phone] Dept Fax: [phone]   HISTORY OF PRESENT ILLNESS:  Discussed the use of AI scribe software for clinical note transcription with the patient, who  gave verbal consent to proceed.  History of Present Illness Vicki Barker is a 78 year old female who presents with low white blood cell and platelet counts.  Labs at her PCPs office on 05/12/2024 showed white count of 2900 with ANC of 1000, ALC of 1300, neutrophils 36%, lymphocytes 44%, monocytes 19%.  Hemoglobin was 13, MCV 88.4.  Platelet count was 100,000.  She was referred to us  for further evaluation of leukopenia and thrombocytopenia.   On review of records from an outside facility, labs in May 2020 showed white count of 3300 with ANC of 1100, ALC of 1400.  Platelet count was slightly low at 144,000 at that time.  Hemoglobin was normal at 12.8.  She is currently taking thyroid  medication and vitamin D supplements. She has been receiving Prolia injections for the past five or six years, with the last injection in May. She has not started any new medications recently and does not take any over-the-counter supplements or plant-based medicines. No history of blood thinner use.  No recurrent infections, fevers, chills, or night sweats. She has not had a cold in a long time and has never been symptomatic for COVID-19. No bleeding problems such as epistaxis or gum bleeds. Her red blood cell count and hemoglobin levels have been normal. She believes her iron levels are also normal based on previous blood work results.  Her cholesterol and thyroid  function, which were elevated last year, have improved this year. 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 She works part-time at [employer] and reports having plenty of energy and not feeling tired.    She denies fever, cough, diarrhea, or other infectious symptoms.  She denies epistaxis, bloody stool, melena, hematuria, bruising or other bleeding symptoms. She also denies unintentional weight loss, night sweats or other constitutional symptoms.  MEDICAL HISTORY Past Medical History:  Diagnosis Date   Arthritis    History of  laparoscopic-assisted vaginal hysterectomy 08/16/2020   Thyroid  disease      SURGICAL HISTORY Past Surgical History:  Procedure Laterality Date   ABDOMINAL HYSTERECTOMY     ANTERIOR AND POSTERIOR VAGINAL REPAIR     TUBAL LIGATION       SOCIAL HISTORY: She reports that she has never smoked. She has never used smokeless tobacco. She reports that she does not drink alcohol and does not use drugs. Social History   Socioeconomic History   Marital status: Married    Spouse name: Not on file   Number of children: Not on file   Years of education: Not on file   Highest education level: Not on file  Occupational History   Not on file  Tobacco Use   Smoking status: Never   Smokeless tobacco: Never  Vaping Use   Vaping status: Never Used  Substance and Sexual Activity   Alcohol use: Never   Drug use: Never   Sexual activity: Not on file  Other Topics Concern   Not on file  Social History Narrative   Not on file   Social Drivers of Health   Financial Resource Strain: Not on file  Food Insecurity: No Food Insecurity (05/28/2024)   Hunger Vital Sign    Worried About Running Out of Food in the Last Year: Never true    Ran Out of Food in the Last Year: Never true  Transportation Needs: No Transportation Needs (05/28/2024)   PRAPARE - Transportation  Lack of Transportation (Medical): No    Lack of Transportation (Non-Medical): No  Physical Activity: Not on file  Stress: Not on file  Social Connections: Not on file  Intimate Partner Violence: Not At Risk (05/28/2024)   Humiliation, Afraid, Rape, and Kick questionnaire    Fear of Current or Ex-Partner: No    Emotionally Abused: No    Physically Abused: No    Sexually Abused: No    FAMILY HISTORY: Her family history is not on file.  CURRENT MEDICATIONS   Current Outpatient Medications  Medication Instructions   Ergocalciferol 10 MCG (400 UNIT) TABS 1 tablet, Daily   levothyroxine (SYNTHROID) 75 mcg, Daily before  breakfast   Lutein-Zeaxanthin 25-5 MG CAPS 1 tablet, Daily   Prolia 60 mg, Every 6 months     ALLERGIES  She is allergic to penicillins and sulfa antibiotics.  REVIEW OF SYSTEMS:  Review of Systems - Oncology   Rest of the pertinent review of systems is unremarkable except as mentioned above in HPI.  PHYSICAL EXAMINATION:    Onc Performance Status - 05/28/24 0858       ECOG Perf Status   ECOG Perf Status Fully active, able to carry on all pre-disease performance without restriction      KPS SCALE   KPS % SCORE Normal, no compliants, no evidence of disease          Vitals:   05/28/24 0847 05/28/24 0848  BP: (!) 155/71 135/71  Pulse: 91   Resp: 17   Temp: 98.7 F (37.1 C)   SpO2: 99%    Filed Weights   05/28/24 0847  Weight: 99 lb 11.2 oz (45.2 kg)    Physical Exam Constitutional:      General: She is not in acute distress.    Appearance: Normal appearance.  HENT:     Head: Normocephalic and atraumatic.  Cardiovascular:     Rate and Rhythm: Normal rate.     Heart sounds: Normal heart sounds.  Pulmonary:     Effort: Pulmonary effort is normal. No respiratory distress.     Breath sounds: Normal breath sounds.  Abdominal:     General: There is no distension.     Palpations: Abdomen is soft. There is no mass.  Lymphadenopathy:     Cervical: No cervical adenopathy.  Neurological:     General: No focal deficit present.     Mental Status: She is alert and oriented to person, place, and time.  Psychiatric:        Mood and Affect: Mood normal.        Behavior: Behavior normal.      LABORATORY DATA:   I have reviewed the data as listed.  Results for orders placed or performed in visit on 05/28/24  Lactate dehydrogenase  Result Value Ref Range   LDH 185 98 - 192 U/L  Immature Platelet Fraction  Result Value Ref Range   Immature Platelet Fraction 9.5 (H) 1.2 - 8.6 %  Vitamin B12  Result Value Ref Range   Vitamin B-12 718 180 - 914 pg/mL  Folate   Result Value Ref Range   Folate 28.1 >5.9 ng/mL  CMP (Cancer Center only)  Result Value Ref Range   Sodium 138 135 - 145 mmol/L   Potassium 5.0 3.5 - 5.1 mmol/L   Chloride 102 98 - 111 mmol/L   CO2 33 (H) 22 - 32 mmol/L   Glucose, Bld 117 (H) 70 - 99 mg/dL   BUN 15 8 -  23 mg/dL   Creatinine 9.18 9.55 - 1.00 mg/dL   Calcium 9.8 8.9 - 89.6 mg/dL   Total Protein 6.9 6.5 - 8.1 g/dL   Albumin 4.2 3.5 - 5.0 g/dL   AST 13 (L) 15 - 41 U/L   ALT 10 0 - 44 U/L   Alkaline Phosphatase 59 38 - 126 U/L   Total Bilirubin 0.4 0.0 - 1.2 mg/dL   GFR, Estimated >39 >39 mL/min   Anion gap 3 (L) 5 - 15  CBC with Differential (Cancer Center Only)  Result Value Ref Range   WBC Count 3.3 (L) 4.0 - 10.5 K/uL   RBC 4.13 3.87 - 5.11 MIL/uL   Hemoglobin 12.5 12.0 - 15.0 g/dL   HCT 62.9 63.9 - 53.9 %   MCV 89.6 80.0 - 100.0 fL   MCH 30.3 26.0 - 34.0 pg   MCHC 33.8 30.0 - 36.0 g/dL   RDW 86.0 88.4 - 84.4 %   Platelet Count 113 (L) 150 - 400 K/uL   nRBC 0.0 0.0 - 0.2 %   Neutrophils Relative % 37 %   Neutro Abs 1.2 (L) 1.7 - 7.7 K/uL   Lymphocytes Relative 39 %   Lymphs Abs 1.3 0.7 - 4.0 K/uL   Monocytes Relative 24 %   Monocytes Absolute 0.8 0.1 - 1.0 K/uL   Eosinophils Relative 0 %   Eosinophils Absolute 0.0 0.0 - 0.5 K/uL   Basophils Relative 0 %   Basophils Absolute 0.0 0.0 - 0.1 K/uL   WBC Morphology MORPHOLOGY UNREMARKABLE    RBC Morphology MORPHOLOGY UNREMARKABLE    Smear Review LARGE AND GIANT PLTS    Immature Granulocytes 0 %   Abs Immature Granulocytes 0.01 0.00 - 0.07 K/uL     RADIOGRAPHIC STUDIES:  No pertinent imaging studies available to review.  Orders Placed This Encounter  Procedures   CBC with Differential (Cancer Center Only)    Standing Status:   Future    Number of Occurrences:   1    Expiration Date:   05/28/2025   CMP (Cancer Center only)    Standing Status:   Future    Number of Occurrences:   1    Expiration Date:   05/28/2025   Flow Cytometry, Peripheral  Blood (Oncology)    Standing Status:   Future    Number of Occurrences:   1    Expiration Date:   05/28/2025   Folate    Standing Status:   Future    Number of Occurrences:   1    Expiration Date:   05/28/2025   Vitamin B12    Standing Status:   Future    Number of Occurrences:   1    Expiration Date:   05/28/2025   Methylmalonic acid, serum    Standing Status:   Future    Number of Occurrences:   1    Expiration Date:   05/28/2025   ANA w/Reflex if Positive    Standing Status:   Future    Number of Occurrences:   1    Expiration Date:   05/28/2025   Rheumatoid factor    Standing Status:   Future    Number of Occurrences:   1    Expiration Date:   05/28/2025   Immature Platelet Fraction    Standing Status:   Future    Number of Occurrences:   1    Expiration Date:   05/28/2025   Lactate dehydrogenase  Standing Status:   Future    Number of Occurrences:   1    Expiration Date:   05/28/2025   Copper , serum    Standing Status:   Future    Number of Occurrences:   1    Expiration Date:   05/28/2025   BCR-ABL1 FISH    Standing Status:   Future    Number of Occurrences:   1    Expiration Date:   05/28/2025    Future Appointments  Date Time Provider Department Center  06/04/2024 12:00 PM Johnte Portnoy, Chinita, MD [facility] None  07/30/2024  8:15 AM [facility] LAB [facility] None  07/30/2024  8:45 AM Deavon Podgorski, MD [facility] None    I spent a total of 55 minutes during this encounter with the patient including review of chart and various tests results, discussions about plan of care and coordination of care plan.  This document was completed utilizing speech recognition software. Grammatical errors, random word insertions, pronoun errors, and incomplete sentences are an occasional consequence of this system due to software limitations, ambient noise, and hardware issues. Any formal questions or concerns about the content, text or information contained within the body of this  dictation should be directly addressed to the provider for clarification.

## 2024-05-29 LAB — COPPER, SERUM: Copper: 113 ug/dL (ref 80–158)

## 2024-05-29 LAB — RHEUMATOID FACTOR: Rheumatoid fact SerPl-aCnc: <10 [IU]/mL (ref <14.0)

## 2024-05-29 LAB — ANA W/REFLEX IF POSITIVE: Anti Nuclear Antibody (ANA): NEGATIVE

## 2024-05-29 LAB — SURGICAL PATHOLOGY

## 2024-05-31 LAB — METHYLMALONIC ACID, SERUM: Methylmalonic Acid, Quantitative: 327 nmol/L (ref 0–378)

## 2024-06-01 LAB — FLOW CYTOMETRY

## 2024-06-02 ENCOUNTER — Encounter: Payer: Self-pay | Admitting: Oncology

## 2024-06-02 NOTE — Assessment & Plan Note (Signed)
 Chronic thrombocytopenia with a platelet count of 100, decreased from 144 in May 2020. No symptoms of bleeding such as epistaxis or gum bleeding. Differential diagnosis includes autoimmune destruction of platelets and MDS. Platelet count is above the threshold for treatment with steroids, which would be considered if platelets fall below 30,000.  - Monitor platelet count and assess for any signs of bleeding.  - Platelet count stable at 113,000 today.  Immature platelet fraction is increased at 9.5%, indicating adequate bone marrow response to thrombocytopenia.

## 2024-06-04 ENCOUNTER — Inpatient Hospital Stay (HOSPITAL_BASED_OUTPATIENT_CLINIC_OR_DEPARTMENT_OTHER): Admitting: Oncology

## 2024-06-04 ENCOUNTER — Encounter: Payer: Self-pay | Admitting: Oncology

## 2024-06-04 DIAGNOSIS — D696 Thrombocytopenia, unspecified: Secondary | ICD-10-CM

## 2024-06-04 DIAGNOSIS — D709 Neutropenia, unspecified: Secondary | ICD-10-CM

## 2024-06-04 LAB — BCR-ABL1 FISH
Cells Analyzed: 200
Cells Counted: 200

## 2024-06-04 NOTE — Progress Notes (Signed)
 [facility]  HEMATOLOGY-ONCOLOGY ELECTRONIC VISIT PROGRESS NOTE  PATIENT NAME: Vicki Barker   MR#: [id number] DOB: May 15, 1946  DATE OF SERVICE: 06/04/2024  Patient Care Team: Rolinda Millman, MD as PCP - General (Family Medicine)  I connected with the patient via telephone conference and verified that I am speaking with the correct person using two identifiers. The patient's location is at home and I am providing care from the [facility].  I discussed the limitations, risks, security and privacy concerns of performing an evaluation and management service by e-visits and the availability of in person appointments. I also discussed with the patient that there may be a patient responsible charge related to this service. The patient expressed understanding and agreed to proceed.   ASSESSMENT & PLAN:   Vicki Barker is a 78 y.o. lady with a past medical history of osteoporosis, hypothyroidism, was referred to our service for evaluation of leukopenia and thrombocytopenia.    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 Neutrophil count remains at a safe level of 1,100, indicating preserved immunity. Differential diagnosis includes chronic leukemia, myelodysplastic syndrome (MDS), and autoimmune destruction of cells. Low suspicion for leukemia, MDS is a possibility but considered slim. No symptoms of infection or bleeding, suggesting adequate cell function.  On her consultation with us  on 05/28/2024, labs revealed white count of 3300 with ANC of 1200, ALC of 1300, normal differential.  Morphology unremarkable.  Hemoglobin 12.5, platelet 113,000.  CMP unremarkable.  B12, folate, methylmalonic acid, serum copper , LDH were within normal limits.  ANA and rheumatoid factor were negative. Flow cytometry of peripheral blood was unremarkable.  BCR/ABL 1 testing pending.  Differential diagnosis includes age-related bone  marrow slowing or myelodysplastic syndrome (MDS).   However given stable counts over the last 5 years, no acute intervention would be needed.  Will continue close monitoring at this time.  Plan to see her again in two months to monitor blood counts.   Thrombocytopenia (HCC) Chronic thrombocytopenia with a platelet count of 100, decreased from 144 in May 2020. No symptoms of bleeding such as epistaxis or gum bleeding. Differential diagnosis includes autoimmune destruction of platelets and MDS. Platelet count is above the threshold for treatment with steroids, which would be considered if platelets fall below 30,000.   On our consultation with us  on 05/28/2024, platelet count was stable at 113,000.  Immature platelet fraction was increased at 9.5%, indicating adequate bone marrow response to thrombocytopenia.   I discussed the assessment and treatment plan with the patient. The patient was provided an opportunity to ask questions and all were answered. The patient agreed with the plan and demonstrated an understanding of the instructions. The patient was advised to call back or seek an in-person evaluation if the symptoms worsen or if the condition fails to improve as anticipated.    I spent 12 minutes over the phone with the patient reviewing test results, discuss management and coordination/planning of care.  [name], MD 06/04/2024 3:10 PM [facility] - A DE[facility] [address] [place] [address] Dept: [phone] Dept Fax: [phone]   INTERVAL HISTORY:  Please see above for problem oriented charting.  The purpose of today's discussion is to explain recent lab results and to formulate plan of care.  Discussed the use of AI scribe software for clinical note transcription with the  patient, who gave verbal consent to proceed.  History of Present Illness Vicki Barker is a 78 year old female who  presents with low white blood cell and platelet counts. She was referred by her primary doctor for evaluation of low white blood cell and platelet counts.  She has experienced low white blood cell counts since May 2020, with a recent count of 3,300, slightly improved from a previous count of 2,900 in June. Her red blood cell count and chemistries are normal. An iron study was not conducted this time as previous results were normal.  Her platelet count was noted to be 100,000 by her primary doctor, but recent testing showed a slight improvement to 113,000. Normal platelet count is 150,000. A comprehensive workup was conducted, including tests for B12, folic acid, copper  levels, and screening for lupus and rheumatoid arthritis, all of which returned normal results. A bone marrow abnormality test was also negative.  No symptoms such as fatigue are present, and she describes herself as 'very active,' indicating that the low counts have not impacted her daily life.     SUMMARY OF HEMATOLOGY HISTORY:  Labs at her PCPs office on 05/12/2024 showed white count of 2900 with ANC of 1000, ALC of 1300, neutrophils 36%, lymphocytes 44%, monocytes 19%.  Hemoglobin was 13, MCV 88.4.  Platelet count was 100,000.  She was referred to us  for further evaluation of leukopenia and thrombocytopenia.    On review of records from an outside facility, labs in May 2020 showed white count of 3300 with ANC of 1100, ALC of 1400.  Platelet count was slightly low at 144,000 at that time.  Hemoglobin was normal at 12.8.   She is currently taking thyroid  medication and vitamin D supplements. She has been receiving Prolia injections for the past five or six years, with the last injection in May. She has not started any new medications recently and does not take any over-the-counter supplements or plant-based medicines. No history of blood thinner use.   No recurrent infections, fevers, chills, or night sweats. She has not had a cold  in a long time and has never been symptomatic for COVID-19. No bleeding problems such as epistaxis or gum bleeds. Her red blood cell count and hemoglobin levels have been normal. She believes her iron levels are also normal based on previous blood work results.   Her cholesterol and thyroid  function, which were elevated last year, have improved this year. 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 She works part-time at [employer] and reports having plenty of energy and not feeling tired.  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 Neutrophil count remains at a safe level of 1,100, indicating preserved immunity. Differential diagnosis includes chronic leukemia, myelodysplastic syndrome (MDS), and autoimmune destruction of cells. Low suspicion for leukemia, MDS is a possibility but considered slim. No symptoms of infection or bleeding, suggesting adequate cell function.   On her consultation with us  on 05/28/2024, labs revealed white count of 3300 with ANC of 1200, ALC of 1300, normal differential.  Morphology unremarkable.  Hemoglobin 12.5, platelet 113,000.  CMP unremarkable.  B12, folate, methylmalonic acid, serum copper , LDH were within normal limits.  ANA and rheumatoid factor were negative. Flow cytometry of peripheral blood was unremarkable.  BCR/ABL 1 testing pending.  Plan to see her again in two months to monitor  blood counts.   Chronic thrombocytopenia with a platelet count of 100, decreased from 144 in May 2020. No symptoms of bleeding such as epistaxis or gum bleeding. Differential diagnosis includes autoimmune destruction of platelets and MDS. Platelet count is above the threshold for treatment with steroids, which would be considered if platelets fall below 30,000.  On our consultation with us  on 05/28/2024, platelet count was stable at 113,000.  Immature  platelet fraction was increased at 9.5%, indicating adequate bone marrow response to thrombocytopenia.   REVIEW OF SYSTEMS:    Review of Systems - Oncology  All other pertinent systems were reviewed with the patient and are negative.  I have reviewed the past medical history, past surgical history, social history and family history with the patient and they are unchanged from previous note.  ALLERGIES:  She is allergic to penicillins and sulfa antibiotics.  MEDICATIONS:  Current Outpatient Medications  Medication Sig Dispense Refill   Ergocalciferol 10 MCG (400 UNIT) TABS Take 1 tablet by mouth daily.     levothyroxine (SYNTHROID) 75 MCG tablet Take 75 mcg by mouth daily before breakfast.     Lutein-Zeaxanthin 25-5 MG CAPS Take 1 tablet by mouth daily.     PROLIA 60 MG/ML SOSY injection Inject 60 mg into the skin every 6 (six) months.     No current facility-administered medications for this visit.    PHYSICAL EXAMINATION:    Onc Performance Status - 06/04/24 1200       ECOG Perf Status   ECOG Perf Status Fully active, able to carry on all pre-disease performance without restriction      KPS SCALE   KPS % SCORE Normal, no compliants, no evidence of disease          LABORATORY DATA:   I have reviewed the data as listed.  Recent Results (from the past 2160 hours)  Surgical pathology     Status: None   Collection Time: 05/28/24 12:00 AM  Result Value Ref Range   SURGICAL PATHOLOGY      Surgical Pathology CASE: [id number] PATIENT: ROCK BECK Flow Pathology Report     Clinical history: Neutropenia     DIAGNOSIS:  - No abnormal B or T-cell population identified  GATING AND PHENOTYPIC ANALYSIS:  Gated population: Flow cytometric immunophenotyping is performed using antibodies to the antigens listed in the table below. Electronic gates are placed around a cell cluster displaying light scatter properties corresponding to: lymphocytes  Abnormal Cells  in gated population: See comment  Phenotype of Abnormal Cells: See comment                      Lymphoid Antigens       Myeloid Antigens Miscellaneous CD2  tested    CD10 tested    CD11b     ND   CD45 tested CD3  tested    CD19 tested    CD11c     ND   HLA-Dr    ND CD4  tested    CD20 tested    CD13 ND   CD34 tested CD5  tested    CD22 ND   CD14 ND   CD38 tested CD7  tested    CD79b     ND   CD15 ND   CD138     ND CD8  tested    CD103     ND   CD16 ND   TdT  ND CD25 ND   CD200  teste d    CD33 ND   CD123     ND TCRab     ND   sKappa    tested    CD64 ND   CD41 ND TCRgd     tested    sLambda   tested    CD117     ND   CD61 ND CD56 tested    cKappa    ND   MPO  ND   CD71 ND CD57 ND   cLambda   ND        [id number]     GROSS DESCRIPTION:  One lavender top tube submitted from [facility] for lymphoma testing.    Final Diagnosis performed by Ilsa Pottier, MD.   Electronically signed 05/29/2024 Technical and / or Professional components performed at [facility], [address], [place], [place] [address].  The above tests were developed and their performance characteristics determined by the [facility] system for the physical and immunophenotypic characterization of cell populations. They have not been cleared by the U.S. Food and Drug administration. The  FDA has determined that such clearance or approval is not necessary. This test is used for clinical purposes. It should not be  regarded as investigational or for research   Copper , serum     Status: None   Collection Time: 05/28/24  9:33 AM  Result Value Ref Range   Copper  113 80 - 158 ug/dL    Comment: (NOTE) This test was developed and its performance characteristics determined by [facility]. It has not been cleared or approved by the Food and Drug Administration.                                Detection Limit = 5 Performed At: [facility] [address] [place], [place] [id number] Jennette Shorter  MD Ey:[id number]   Immature Platelet Fraction     Status: Abnormal   Collection Time: 05/28/24  9:33 AM  Result Value Ref Range   Immature Platelet Fraction 9.5 (H) 1.2 - 8.6 %    Comment:        An elevated IPF indicates increased platelet production. A low platelet count with an elevated IPF may be associated with peripheral platelet destruction (e.g. DIC, ITP) or bone marrow recovery (e.g. after chemotherapy or transplant). A low platelet count with a low or non- elevated IPF is consistent with a platelet production disorder. Performed at [facility], [address], [place], [place] [address]   Rheumatoid factor     Status: None   Collection Time: 05/28/24  9:33 AM  Result Value Ref Range   Rheumatoid fact SerPl-aCnc <10.0 <14.0 IU/mL    Comment: (NOTE) Performed At: [facility] [address] [place], [place] [id number] Jennette Shorter MD Ey:[id number]   Methylmalonic acid, serum     Status: None   Collection Time: 05/28/24  9:33 AM  Result Value Ref Range   Methylmalonic Acid, Quantitative 327 0 - 378 nmol/L    Comment: (NOTE) This test was developed and its performance characteristics determined by [facility]. It has not been cleared or approved by the Food and Drug Administration. Performed At: [facility] [address] [place], [place] [id number] Jennette Shorter MD Ey:[id number]   Vitamin B12     Status: None   Collection Time: 05/28/24  9:33 AM  Result Value Ref Range   Vitamin B-12 718 180 - 914  pg/mL    Comment: (NOTE) This assay is not validated for testing neonatal or myeloproliferative syndrome specimens for Vitamin B12 levels. Performed at [facility], [address], [place], [place] [address]   Flow Cytometry, Peripheral Blood (Oncology)     Status: None   Collection Time: 05/28/24  9:33 AM  Result Value Ref Range   Flow Cytometry SEE SEPARATE REPORT     Comment: Performed at [facility], [address], [place], [place] [address]  CMP (Cancer Center only)     Status: Abnormal   Collection Time: 05/28/24  9:33 AM  Result Value Ref Range   Sodium 138 135 - 145 mmol/L   Potassium 5.0 3.5 - 5.1 mmol/L   Chloride 102 98 - 111 mmol/L   CO2 33 (H) 22 - 32 mmol/L   Glucose, Bld 117 (H) 70 - 99 mg/dL    Comment: Glucose reference range applies only to samples taken after fasting for at least 8 hours.   BUN 15 8 - 23 mg/dL   Creatinine 9.18 9.55 - 1.00 mg/dL   Calcium 9.8 8.9 - 89.6 mg/dL   Total Protein 6.9 6.5 - 8.1 g/dL   Albumin 4.2 3.5 - 5.0 g/dL   AST 13 (L) 15 - 41 U/L   ALT 10 0 - 44 U/L   Alkaline Phosphatase 59 38 - 126 U/L   Total Bilirubin 0.4 0.0 - 1.2 mg/dL   GFR, Estimated >39 >39 mL/min    Comment: (NOTE) Calculated using the CKD-EPI Creatinine Equation (2021)    Anion gap 3 (L) 5 - 15    Comment: Performed at [facility], [address] [place], [place] [address]  CBC with Differential (Cancer Center Only)     Status: Abnormal   Collection Time: 05/28/24  9:33 AM  Result Value Ref Range   WBC Count 3.3 (L) 4.0 - 10.5 K/uL    Comment: WHITE COUNT CONFIRMED ON SMEAR   RBC 4.13 3.87 - 5.11 MIL/uL   Hemoglobin 12.5 12.0 - 15.0 g/dL   HCT 62.9 63.9 - 53.9 %   MCV 89.6 80.0 - 100.0 fL   MCH 30.3 26.0 - 34.0 pg   MCHC 33.8 30.0 - 36.0 g/dL   RDW 86.0 88.4 - 84.4 %   Platelet Count 113 (L) 150 - 400 K/uL    Comment: PLATELET COUNT CONFIRMED BY SMEAR   nRBC 0.0 0.0 - 0.2 %   Neutrophils Relative % 37 %   Neutro Abs 1.2 (L) 1.7 - 7.7 K/uL   Lymphocytes Relative 39 %   Lymphs Abs 1.3 0.7 - 4.0 K/uL   Monocytes Relative 24 %   Monocytes Absolute 0.8 0.1 - 1.0 K/uL   Eosinophils Relative 0 %   Eosinophils Absolute 0.0 0.0 - 0.5 K/uL   Basophils Relative 0 %   Basophils Absolute 0.0 0.0 - 0.1 K/uL   WBC Morphology MORPHOLOGY UNREMARKABLE    RBC Morphology MORPHOLOGY UNREMARKABLE    Smear Review  LARGE AND GIANT PLTS    Immature Granulocytes 0 %   Abs Immature Granulocytes 0.01 0.00 - 0.07 K/uL    Comment: Performed at [facility], [address] [place], [place] [address]  Lactate dehydrogenase     Status: None   Collection Time: 05/28/24  9:34 AM  Result Value Ref Range   LDH 185 98 - 192 U/L    Comment: Performed at [facility], [address], [place], [place] [address]  ANA w/Reflex if Positive     Status: None   Collection Time: 05/28/24  9:34 AM  Result Value Ref Range   Anti Nuclear Antibody (ANA) Negative Negative    Comment: (NOTE) Performed At: [facility] [address] [place], [place] [id number] Jennette Shorter MD Ey:[id number]   Folate     Status: None   Collection Time: 05/28/24  9:34 AM  Result Value Ref Range   Folate 28.1 >5.9 ng/mL    Comment: RESULT CONFIRMED BY MANUAL DILUTION Performed at [facility], [address], [place], [place] [address]      RADIOGRAPHIC STUDIES:  No recent pertinent imaging studies available to review.  Orders Placed This Encounter  Procedures   CBC with Differential (Cancer Center Only)    Standing Status:   Future    Expected Date:   07/30/2024    Expiration Date:   10/28/2024   Iron and Iron Binding Capacity ([facility] only)    Standing Status:   Future    Expected Date:   07/30/2024    Expiration Date:   10/28/2024   Ferritin    Standing Status:   Future    Expected Date:   07/30/2024    Expiration Date:   10/28/2024     Future Appointments  Date Time Provider Department Center  07/30/2024  8:15 AM [facility] None  07/30/2024  8:45 AM Shajuan Musso, Chinita, MD [facility] None    This document was completed utilizing speech recognition software. Grammatical errors, random word insertions, pronoun errors, and incomplete sentences are an occasional consequence of this system due to software limitations, ambient noise, and  hardware issues. Any formal questions or concerns about the content, text or information contained within the body of this dictation should be directly addressed to the provider for clarification.

## 2024-06-04 NOTE — Assessment & Plan Note (Signed)
 Chronic thrombocytopenia with a platelet count of 100, decreased from 144 in May 2020. No symptoms of bleeding such as epistaxis or gum bleeding. Differential diagnosis includes autoimmune destruction of platelets and MDS. Platelet count is above the threshold for treatment with steroids, which would be considered if platelets fall below 30,000.   On our consultation with us  on 05/28/2024, platelet count was stable at 113,000.  Immature platelet fraction was increased at 9.5%, indicating adequate bone marrow response to thrombocytopenia.

## 2024-06-04 NOTE — Assessment & Plan Note (Signed)
 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 Neutrophil count remains at a safe level of 1,100, indicating preserved immunity. Differential diagnosis includes chronic leukemia, myelodysplastic syndrome (MDS), and autoimmune destruction of cells. Low suspicion for leukemia, MDS is a possibility but considered slim. No symptoms of infection or bleeding, suggesting adequate cell function.  On her consultation with us  on 05/28/2024, labs revealed white count of 3300 with ANC of 1200, ALC of 1300, normal differential.  Morphology unremarkable.  Hemoglobin 12.5, platelet 113,000.  CMP unremarkable.  B12, folate, methylmalonic acid, serum copper , LDH were within normal limits.  ANA and rheumatoid factor were negative. Flow cytometry of peripheral blood was unremarkable.  BCR/ABL 1 testing pending.  Differential diagnosis includes age-related bone marrow slowing or myelodysplastic syndrome (MDS).   However given stable counts over the last 5 years, no acute intervention would be needed.  Will continue close monitoring at this time.  Plan to see her again in two months to monitor blood counts.

## 2024-07-30 ENCOUNTER — Inpatient Hospital Stay: Attending: Oncology

## 2024-07-30 ENCOUNTER — Inpatient Hospital Stay: Admitting: Oncology

## 2024-07-30 ENCOUNTER — Encounter: Payer: Self-pay | Admitting: Oncology

## 2024-07-30 VITALS — BP 142/57 | HR 89 | Temp 98.6°F | Resp 16 | Ht 62.0 in | Wt 101.0 lb

## 2024-07-30 DIAGNOSIS — D696 Thrombocytopenia, unspecified: Secondary | ICD-10-CM | POA: Insufficient documentation

## 2024-07-30 DIAGNOSIS — M19049 Primary osteoarthritis, unspecified hand: Secondary | ICD-10-CM | POA: Diagnosis not present

## 2024-07-30 DIAGNOSIS — D709 Neutropenia, unspecified: Secondary | ICD-10-CM | POA: Insufficient documentation

## 2024-07-30 DIAGNOSIS — M810 Age-related osteoporosis without current pathological fracture: Secondary | ICD-10-CM | POA: Diagnosis not present

## 2024-07-30 LAB — CBC WITH DIFFERENTIAL (CANCER CENTER ONLY)
Abs Immature Granulocytes: 0.01 K/uL (ref 0.00–0.07)
Basophils Absolute: 0 K/uL (ref 0.0–0.1)
Basophils Relative: 0 %
Eosinophils Absolute: 0 K/uL (ref 0.0–0.5)
Eosinophils Relative: 0 %
HCT: 38.4 % (ref 36.0–46.0)
Hemoglobin: 12.9 g/dL (ref 12.0–15.0)
Immature Granulocytes: 0 %
Lymphocytes Relative: 40 %
Lymphs Abs: 1.2 K/uL (ref 0.7–4.0)
MCH: 29.7 pg (ref 26.0–34.0)
MCHC: 33.6 g/dL (ref 30.0–36.0)
MCV: 88.3 fL (ref 80.0–100.0)
Monocytes Absolute: 0.5 K/uL (ref 0.1–1.0)
Monocytes Relative: 18 %
Neutro Abs: 1.2 K/uL — ABNORMAL LOW (ref 1.7–7.7)
Neutrophils Relative %: 42 %
Platelet Count: 107 K/uL — ABNORMAL LOW (ref 150–400)
RBC: 4.35 MIL/uL (ref 3.87–5.11)
RDW: 13.6 % (ref 11.5–15.5)
WBC Count: 2.9 K/uL — ABNORMAL LOW (ref 4.0–10.5)
nRBC: 0 % (ref 0.0–0.2)

## 2024-07-30 LAB — IRON AND IRON BINDING CAPACITY (CC-WL,HP ONLY)
Iron: 114 ug/dL (ref 28–170)
Saturation Ratios: 32 % — ABNORMAL HIGH (ref 10.4–31.8)
TIBC: 361 ug/dL (ref 250–450)
UIBC: 247 ug/dL (ref 148–442)

## 2024-07-30 LAB — FERRITIN: Ferritin: 84 ng/mL (ref 11–307)

## 2024-07-30 NOTE — Progress Notes (Signed)
 [facility]  HEMATOLOGY CLINIC PROGRESS NOTE  PATIENT NAME: Vicki [facility]   MR#: [id number] DOB: 03-Aug-1946  Patient Care Team: Rolinda Millman, MD as PCP - General (Family Medicine)  Date of visit: 07/30/2024   ASSESSMENT & PLAN:   Vicki [facility] is a 78 y.o.  lady with a past medical history of osteoporosis, hypothyroidism, was referred to our service for evaluation of leukopenia and thrombocytopenia.     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 Neutrophil count remains at a safe level of 1,100, indicating preserved immunity. Differential diagnosis includes myelodysplastic syndrome (MDS), and autoimmune destruction of cells. Low suspicion for leukemia, MDS is a possibility but considered slim. No symptoms of infection or bleeding, suggesting adequate cell function.  On her consultation with us  on 05/28/2024, labs revealed white count of 3300 with ANC of 1200, ALC of 1300, normal differential.  Morphology unremarkable.  Hemoglobin 12.5, platelet 113,000.  CMP unremarkable.  B12, folate, methylmalonic acid, serum copper , LDH were within normal limits.  ANA and rheumatoid factor were negative. Flow cytometry of peripheral blood was unremarkable.  BCR/ABL 1 testing was negative.   Labs today showed stable white count of 2900, platelet count stable at 107,000.  ANC is at a safe level of 1200.  Differential diagnosis includes age-related bone marrow slowing or myelodysplastic syndrome (MDS).   However given stable counts over the last 5 years, no acute intervention would be needed.  Will continue close monitoring at this time.  - Advise to seek immediate blood count check if fever of 101F or more occurs - Allow COVID vaccination as there are no contraindications  Plan to see her again in 3 months to monitor blood counts.  Thrombocytopenia (HCC) Chronic thrombocytopenia with a platelet count in the range  of 100,000 to 140,000 since May 2020. No symptoms of bleeding such as epistaxis or gum bleeding. Differential diagnosis includes autoimmune destruction of platelets and MDS. Platelet count is above the threshold for treatment with steroids, which would be considered if platelets fall below 30,000.   On our consultation with us  on 05/28/2024, platelet count was stable at 113,000.  Immature platelet fraction was increased at 9.5%, indicating adequate bone marrow response to thrombocytopenia.  Osteoporosis Osteoporosis managed with Prolia for the past five to six years. Last injection was in May 2025.   I spent a total of 30 minutes during this encounter with the patient including review of chart and various tests results, discussions about plan of care and coordination of care plan.  I reviewed lab results and outside records for this visit and discussed relevant results with the patient. Diagnosis, plan of care and treatment options were also discussed in detail with the patient. Opportunity provided to ask questions and answers provided to her apparent satisfaction. Provided instructions to call our clinic with any problems, questions or concerns prior to return visit. I recommended to continue follow-up with PCP and sub-specialists. She verbalized understanding and agreed with the plan. No barriers to learning was detected.  Vicki Patten, MD  07/30/2024 5:21 PM  [facility] - [facility] [address] [place] [address] Dept: [phone] Dept Fax: [phone]   CHIEF COMPLAINT/ REASON FOR VISIT:  Follow-up for chronic leukopenia and thrombocytopenia, at least since 2020.  INTERVAL HISTORY:  Discussed the use of AI scribe software for  clinical note transcription with the patient, who gave verbal consent to proceed.  History of Present Illness Vicki [facility] is a 78 year old female who presents for follow-up  of her hematological condition.  She has experienced low blood counts since at least 2020, with consistently low white blood cell and platelet counts. Her white blood cell count was 3,300 in May 2020, and has recently been 2,900 in June and today. Her recent platelet count was 107,000. Her sister and brother have similar blood count issues, suggesting a possible familial component. No fevers, chills, or night sweats.  She has been on Prolia for several years for osteopenia, but her insurance will no longer cover it as of September 1st. She is unable to take oral medications for osteoporosis due to her use of levothyroxine. She takes 1,000 units of vitamin D separately and an additional 600 units with calcium, totaling 1,600 units of vitamin D daily.  She experienced swelling in her right foot following the rupture of a ganglion cyst on her toe. She was prescribed antibiotics for ten days due to concerns of infection, but the swelling persisted, indicating it was not infectious. She reports that the fluid from the cyst has spread across her foot, causing swelling. She is on her feet frequently, which may impact the swelling.   SUMMARY OF HEMATOLOGIC HISTORY:  Labs at her PCPs office on 05/12/2024 showed white count of 2900 with ANC of 1000, ALC of 1300, neutrophils 36%, lymphocytes 44%, monocytes 19%.  Hemoglobin was 13, MCV 88.4.  Platelet count was 100,000.  She was referred to us  for further evaluation of leukopenia and thrombocytopenia.    On review of records from an outside facility, labs in May 2020 showed white count of 3300 with ANC of 1100, ALC of 1400.  Platelet count was slightly low at 144,000 at that time.  Hemoglobin was normal at 12.8.   She is currently taking thyroid  medication and vitamin D supplements. She has been receiving Prolia injections for the past five or six years, with the last injection in May. She has not started any new medications recently and does not take any  over-the-counter supplements or plant-based medicines. No history of blood thinner use.   No recurrent infections, fevers, chills, or night sweats. She has not had a cold in a long time and has never been symptomatic for COVID-19. No bleeding problems such as epistaxis or gum bleeds. Her red blood cell count and hemoglobin levels have been normal. She believes her iron levels are also normal based on previous blood work results.   Her cholesterol and thyroid  function, which were elevated last year, have improved this year. 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 She works part-time at [employer] and reports having plenty of energy and not feeling tired.   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 Neutrophil count remains at a safe level of 1,100, indicating preserved immunity. Differential diagnosis includes chronic leukemia, myelodysplastic syndrome (MDS), and autoimmune destruction of cells. Low suspicion for leukemia, MDS is a possibility but considered slim. No symptoms of infection or bleeding, suggesting adequate cell function.   On her consultation with us  on 05/28/2024, labs revealed white count of 3300 with ANC of 1200, ALC of 1300, normal differential.  Morphology unremarkable.  Hemoglobin 12.5, platelet 113,000.  CMP unremarkable.  B12, folate, methylmalonic  acid, serum copper , LDH were within normal limits.  ANA and rheumatoid factor were negative. Flow cytometry of peripheral blood was unremarkable.  BCR/ABL 1 testing was negative.   Chronic thrombocytopenia with a platelet count in the range of 100,000 to 140,000 since May 2020. No symptoms of bleeding such as epistaxis or gum bleeding. Differential diagnosis includes autoimmune destruction of platelets and MDS. Platelet count is above the threshold for treatment with steroids, which would be considered  if platelets fall below 30,000.   On our consultation with us  on 05/28/2024, platelet count was stable at 113,000.  Immature platelet fraction was increased at 9.5%, indicating adequate bone marrow response to thrombocytopenia.  I have reviewed the past medical history, past surgical history, social history and family history with the patient and they are unchanged from previous note.  ALLERGIES: She is allergic to penicillins and sulfa antibiotics.  MEDICATIONS:  Current Outpatient Medications  Medication Sig Dispense Refill   Calcium Carb-Cholecalciferol (CALCIUM 600 + D) 600-5 MG-MCG TABS Take 1 tablet by mouth daily.     Ergocalciferol 10 MCG (400 UNIT) TABS Take 1 tablet by mouth daily.     levothyroxine (SYNTHROID) 75 MCG tablet Take 75 mcg by mouth daily before breakfast.     Lutein-Zeaxanthin 25-5 MG CAPS Take 1 tablet by mouth daily.     PROLIA 60 MG/ML SOSY injection Inject 60 mg into the skin every 6 (six) months.     No current facility-administered medications for this visit.     REVIEW OF SYSTEMS:    Review of Systems - Oncology  All other pertinent systems were reviewed with the patient and are negative.  PHYSICAL EXAMINATION:    Onc Performance Status - 07/30/24 0849       ECOG Perf Status   ECOG Perf Status Fully active, able to carry on all pre-disease performance without restriction      KPS SCALE   KPS % SCORE Normal, no compliants, no evidence of disease          Vitals:   07/30/24 0842  BP: (!) 142/57  Pulse: 89  Resp: 16  Temp: 98.6 F (37 C)  SpO2: 100%   Filed Weights   07/30/24 0842  Weight: 101 lb (45.8 kg)    Physical Exam Constitutional:      General: She is not in acute distress.    Appearance: Normal appearance.  HENT:     Head: Normocephalic and atraumatic.  Cardiovascular:     Rate and Rhythm: Normal rate.  Pulmonary:     Effort: Pulmonary effort is normal. No respiratory distress.  Abdominal:     General: There is no  distension.  Neurological:     General: No focal deficit present.     Mental Status: She is alert and oriented to person, place, and time.  Psychiatric:        Mood and Affect: Mood normal.        Behavior: Behavior normal.      LABORATORY DATA:   I have reviewed the data as listed.  Results for orders placed or performed in visit on 07/30/24  Ferritin  Result Value Ref Range   Ferritin 84 11 - 307 ng/mL  Iron and Iron Binding Capacity ([facility] only)  Result Value Ref Range   Iron 114 28 - 170 ug/dL   TIBC 638 749 - 549 ug/dL   Saturation Ratios 32 (H) 10.4 - 31.8 %   UIBC 247 148 - 442 ug/dL  CBC  with Differential ([facility])  Result Value Ref Range   WBC Count 2.9 (L) 4.0 - 10.5 K/uL   RBC 4.35 3.87 - 5.11 MIL/uL   Hemoglobin 12.9 12.0 - 15.0 g/dL   HCT 61.5 63.9 - 53.9 %   MCV 88.3 80.0 - 100.0 fL   MCH 29.7 26.0 - 34.0 pg   MCHC 33.6 30.0 - 36.0 g/dL   RDW 86.3 88.4 - 84.4 %   Platelet Count 107 (L) 150 - 400 K/uL   nRBC 0.0 0.0 - 0.2 %   Neutrophils Relative % 42 %   Neutro Abs 1.2 (L) 1.7 - 7.7 K/uL   Lymphocytes Relative 40 %   Lymphs Abs 1.2 0.7 - 4.0 K/uL   Monocytes Relative 18 %   Monocytes Absolute 0.5 0.1 - 1.0 K/uL   Eosinophils Relative 0 %   Eosinophils Absolute 0.0 0.0 - 0.5 K/uL   Basophils Relative 0 %   Basophils Absolute 0.0 0.0 - 0.1 K/uL   Immature Granulocytes 0 %   Abs Immature Granulocytes 0.01 0.00 - 0.07 K/uL     RADIOGRAPHIC STUDIES:  No recent pertinent imaging studies available to review.  Orders Placed This Encounter  Procedures   CBC with Differential ([facility])    Standing Status:   Future    Expected Date:   10/29/2024    Expiration Date:   01/27/2025   Lactate dehydrogenase    Standing Status:   Future    Expected Date:   10/29/2024    Expiration Date:   01/27/2025   CMP ([facility])    Standing Status:   Future    Expected Date:   10/29/2024    Expiration Date:   01/27/2025     Future  Appointments  Date Time Provider Department [facility]  10/29/2024  9:15 AM [facility] None  10/29/2024  9:45 AM Novia Lansberry, Chinita, MD [facility] None      This document was completed utilizing speech recognition software. Grammatical errors, random word insertions, pronoun errors, and incomplete sentences are an occasional consequence of this system due to software limitations, ambient noise, and hardware issues. Any formal questions or concerns about the content, text or information contained within the body of this dictation should be directly addressed to the provider for clarification.

## 2024-07-30 NOTE — Assessment & Plan Note (Signed)
 Osteoporosis managed with Prolia for the past five to six years. Last injection was in May 2025.

## 2024-07-30 NOTE — Assessment & Plan Note (Signed)
 Chronic thrombocytopenia with a platelet count in the range of 100,000 to 140,000 since May 2020. No symptoms of bleeding such as epistaxis or gum bleeding. Differential diagnosis includes autoimmune destruction of platelets and MDS. Platelet count is above the threshold for treatment with steroids, which would be considered if platelets fall below 30,000.   On our consultation with us  on 05/28/2024, platelet count was stable at 113,000.  Immature platelet fraction was increased at 9.5%, indicating adequate bone marrow response to thrombocytopenia.

## 2024-07-30 NOTE — Assessment & Plan Note (Addendum)
 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 Neutrophil count remains at a safe level of 1,100, indicating preserved immunity. Differential diagnosis includes myelodysplastic syndrome (MDS), and autoimmune destruction of cells. Low suspicion for leukemia, MDS is a possibility but considered slim. No symptoms of infection or bleeding, suggesting adequate cell function.  On her consultation with us  on 05/28/2024, labs revealed white count of 3300 with ANC of 1200, ALC of 1300, normal differential.  Morphology unremarkable.  Hemoglobin 12.5, platelet 113,000.  CMP unremarkable.  B12, folate, methylmalonic acid, serum copper , LDH were within normal limits.  ANA and rheumatoid factor were negative. Flow cytometry of peripheral blood was unremarkable.  BCR/ABL 1 testing was negative.   Labs today showed stable white count of 2900, platelet count stable at 107,000.  ANC is at a safe level of 1200.  Differential diagnosis includes age-related bone marrow slowing or myelodysplastic syndrome (MDS).   However given stable counts over the last 5 years, no acute intervention would be needed.  Will continue close monitoring at this time.  - Advise to seek immediate blood count check if fever of 101F or more occurs - Allow COVID vaccination as there are no contraindications  Plan to see her again in 3 months to monitor blood counts.

## 2024-10-29 ENCOUNTER — Encounter: Payer: Self-pay | Admitting: Oncology

## 2024-10-29 ENCOUNTER — Inpatient Hospital Stay: Attending: Oncology

## 2024-10-29 ENCOUNTER — Inpatient Hospital Stay: Admitting: Oncology

## 2024-10-29 VITALS — BP 161/70 | HR 85 | Temp 98.8°F | Resp 18 | Ht 62.0 in | Wt 102.0 lb

## 2024-10-29 DIAGNOSIS — D709 Neutropenia, unspecified: Secondary | ICD-10-CM

## 2024-10-29 DIAGNOSIS — D696 Thrombocytopenia, unspecified: Secondary | ICD-10-CM | POA: Diagnosis not present

## 2024-10-29 DIAGNOSIS — E039 Hypothyroidism, unspecified: Secondary | ICD-10-CM | POA: Insufficient documentation

## 2024-10-29 DIAGNOSIS — Z79890 Hormone replacement therapy (postmenopausal): Secondary | ICD-10-CM | POA: Insufficient documentation

## 2024-10-29 DIAGNOSIS — M810 Age-related osteoporosis without current pathological fracture: Secondary | ICD-10-CM | POA: Diagnosis not present

## 2024-10-29 LAB — CBC WITH DIFFERENTIAL (CANCER CENTER ONLY)
Abs Immature Granulocytes: 0.01 K/uL (ref 0.00–0.07)
Basophils Absolute: 0 K/uL (ref 0.0–0.1)
Basophils Relative: 1 %
Eosinophils Absolute: 0 K/uL (ref 0.0–0.5)
Eosinophils Relative: 0 %
HCT: 35.7 % — ABNORMAL LOW (ref 36.0–46.0)
Hemoglobin: 12.2 g/dL (ref 12.0–15.0)
Immature Granulocytes: 0 %
Lymphocytes Relative: 44 %
Lymphs Abs: 1.3 K/uL (ref 0.7–4.0)
MCH: 30.8 pg (ref 26.0–34.0)
MCHC: 34.2 g/dL (ref 30.0–36.0)
MCV: 90.2 fL (ref 80.0–100.0)
Monocytes Absolute: 0.7 K/uL (ref 0.1–1.0)
Monocytes Relative: 23 %
Neutro Abs: 1 K/uL — ABNORMAL LOW (ref 1.7–7.7)
Neutrophils Relative %: 32 %
Platelet Count: 88 K/uL — ABNORMAL LOW (ref 150–400)
RBC: 3.96 MIL/uL (ref 3.87–5.11)
RDW: 13.8 % (ref 11.5–15.5)
WBC Count: 3 K/uL — ABNORMAL LOW (ref 4.0–10.5)
nRBC: 0 % (ref 0.0–0.2)

## 2024-10-29 LAB — CMP (CANCER CENTER ONLY)
ALT: 11 U/L (ref 0–44)
AST: 21 U/L (ref 15–41)
Albumin: 4.3 g/dL (ref 3.5–5.0)
Alkaline Phosphatase: 79 U/L (ref 38–126)
Anion gap: 8 (ref 5–15)
BUN: 14 mg/dL (ref 8–23)
CO2: 29 mmol/L (ref 22–32)
Calcium: 9.7 mg/dL (ref 8.9–10.3)
Chloride: 101 mmol/L (ref 98–111)
Creatinine: 0.85 mg/dL (ref 0.44–1.00)
GFR, Estimated: >60 mL/min (ref >60)
Glucose, Bld: 100 mg/dL — ABNORMAL HIGH (ref 70–99)
Potassium: 4.8 mmol/L (ref 3.5–5.1)
Sodium: 137 mmol/L (ref 135–145)
Total Bilirubin: 0.3 mg/dL (ref 0.0–1.2)
Total Protein: 7 g/dL (ref 6.5–8.1)

## 2024-10-29 LAB — LACTATE DEHYDROGENASE: LDH: 236 U/L — ABNORMAL HIGH (ref 105–235)

## 2024-10-29 NOTE — Assessment & Plan Note (Addendum)
 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 Neutrophil count remains at a safe level of 1,100, indicating preserved immunity. Differential diagnosis includes myelodysplastic syndrome (MDS), and autoimmune destruction of cells. Low suspicion for leukemia, MDS is a possibility but considered slim. No symptoms of infection or bleeding, suggesting adequate cell function.  On her consultation with us  on 05/28/2024, labs revealed white count of 3300 with ANC of 1200, ALC of 1300, normal differential.  Morphology unremarkable.  Hemoglobin 12.5, platelet 113,000.  CMP unremarkable.  B12, folate, methylmalonic acid, serum copper , LDH were within normal limits.  ANA and rheumatoid factor were negative. Flow cytometry of peripheral blood was unremarkable.  BCR/ABL 1 testing was negative.   Labs today showed stable white count of 3000, platelet count decreased at 88,000.  ANC is at a safe level of 1000.  Differential diagnosis includes age-related bone marrow slowing or myelodysplastic syndrome (MDS).   However given stable counts over the last 5 years, no acute intervention would be needed.  Will continue close monitoring at this time.  - Advise to seek immediate blood count check if fever of 101F or more occurs  Plan to see her again in 3 months to monitor blood counts.  If progressive decline in blood counts is noted, we will refer her for bone marrow biopsy/aspiration.

## 2024-10-29 NOTE — Assessment & Plan Note (Addendum)
 Age-related osteoporosis. Recently transitioned from Prolia to Jubbonti due to insurance coverage; received Jubbonti injection on November 24. Continues vitamin D and calcium supplementation. No new symptoms or complications. - Confirmed ongoing use of vitamin D and calcium supplementation.

## 2024-10-29 NOTE — Progress Notes (Signed)
 [facility]  HEMATOLOGY CLINIC PROGRESS NOTE  PATIENT NAME: Vicki Barker   MR#: [id number] DOB: 07/23/1946  Patient Care Team: Rolinda Millman, MD as PCP - General (Family Medicine)  Date of visit: 10/29/2024   ASSESSMENT & PLAN:   Vicki Barker is a 78 y.o.  lady with a past medical history of osteoporosis, hypothyroidism, was referred to our service for evaluation of leukopenia and thrombocytopenia.     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 Neutrophil count remains at a safe level of 1,100, indicating preserved immunity. Differential diagnosis includes myelodysplastic syndrome (MDS), and autoimmune destruction of cells. Low suspicion for leukemia, MDS is a possibility but considered slim. No symptoms of infection or bleeding, suggesting adequate cell function.  On her consultation with us  on 05/28/2024, labs revealed white count of 3300 with ANC of 1200, ALC of 1300, normal differential.  Morphology unremarkable.  Hemoglobin 12.5, platelet 113,000.  CMP unremarkable.  B12, folate, methylmalonic acid, serum copper , LDH were within normal limits.  ANA and rheumatoid factor were negative. Flow cytometry of peripheral blood was unremarkable.  BCR/ABL 1 testing was negative.   Labs today showed stable white count of 3000, platelet count decreased at 88,000.  ANC is at a safe level of 1000.  Differential diagnosis includes age-related bone marrow slowing or myelodysplastic syndrome (MDS).   However given stable counts over the last 5 years, no acute intervention would be needed.  Will continue close monitoring at this time.  - Advise to seek immediate blood count check if fever of 101F or more occurs  Plan to see her again in 3 months to monitor blood counts.  If progressive decline in blood counts is noted, we will refer her for bone marrow biopsy/aspiration.  Thrombocytopenia Chronic thrombocytopenia  with a platelet count in the range of 100,000 to 140,000 since May 2020. No symptoms of bleeding such as epistaxis or gum bleeding. Differential diagnosis includes autoimmune destruction of platelets and MDS. Platelet count is above the threshold for treatment with steroids, which would be considered if platelets fall below 30,000.   On our consultation with us  on 05/28/2024, platelet count was stable at 113,000.  Immature platelet fraction was increased at 9.5%, indicating adequate bone marrow response to thrombocytopenia.  Osteoporosis Age-related osteoporosis. Recently transitioned from Prolia to Jubbonti due to insurance coverage; received Jubbonti injection on November 24. Continues vitamin D and calcium supplementation. No new symptoms or complications. - Confirmed ongoing use of vitamin D and calcium supplementation.    I spent a total of 30 minutes during this encounter with the patient including review of chart and various tests results, discussions about plan of care and coordination of care plan.  I reviewed lab results and outside records for this visit and discussed relevant results with the patient. Diagnosis, plan of care and treatment options were also discussed in detail with the patient. Opportunity provided to ask questions and answers provided to her apparent satisfaction. Provided instructions to call our clinic with any problems, questions or concerns prior to return visit. I recommended to continue follow-up with PCP and sub-specialists. She verbalized understanding and agreed with the plan. No barriers to learning was detected.  [name], MD  10/29/2024 6:51 PM  [facility] - A DEPT OF [facility] [address] [place] [address] Dept: [phone] Dept Fax: [phone]  CHIEF COMPLAINT/ REASON FOR VISIT:  Follow-up for chronic leukopenia and thrombocytopenia, at least since 2020.  INTERVAL  HISTORY:  Discussed the use of AI scribe software for clinical note transcription with the patient, who gave verbal consent to proceed.  History of Present Illness  Vicki Barker is a 78 year old female with chronic mild neutropenia and thrombocytopenia who presents for routine evaluation of persistent cytopenias.  She has longstanding mild neutropenia and thrombocytopenia, with current laboratory results showing a white blood cell count of 3,000/L (previously 2,900/L), hemoglobin of 12.2 g/dL, and platelets at 11,999/O (previously 107,000/L). She remains asymptomatic, denying bleeding, ecchymosis, fevers, chills, night sweats, or other constitutional symptoms. She has not noted any changes in her condition and maintains a good appetite.  Previous laboratory evaluations, including iron studies and folic acid  levels, have been within normal limits.  She recently transitioned from Prolia to Javante for osteoporosis management due to insurance changes, receiving her Javante injection on November 24. She continues vitamin D and calcium supplementation.   SUMMARY OF HEMATOLOGIC HISTORY:  Labs at her PCPs office on 05/12/2024 showed white count of 2900 with ANC of 1000, ALC of 1300, neutrophils 36%, lymphocytes 44%, monocytes 19%.  Hemoglobin was 13, MCV 88.4.  Platelet count was 100,000.  She was referred to us  for further evaluation of leukopenia and thrombocytopenia.    On review of records from an outside facility, labs in May 2020 showed white count of 3300 with ANC of 1100, ALC of 1400.  Platelet count was slightly low at 144,000 at that time.  Hemoglobin was normal at 12.8.   She is currently taking thyroid  medication and vitamin D supplements. She has been receiving Prolia injections for the past five or six years, with the last injection in May. She has not started any new medications recently and does not take any over-the-counter supplements or plant-based medicines. No history of blood  thinner use.   No recurrent infections, fevers, chills, or night sweats. She has not had a cold in a long time and has never been symptomatic for COVID-19. No bleeding problems such as epistaxis or gum bleeds. Her red blood cell count and hemoglobin levels have been normal. She believes her iron levels are also normal based on previous blood work results.   Her cholesterol and thyroid  function, which were elevated last year, have improved this year. 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 She works part-time at the [employer] and reports having plenty of energy and not feeling tired.   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 Neutrophil count remains at a safe level of 1,100, indicating preserved immunity. Differential diagnosis includes chronic leukemia, myelodysplastic syndrome (MDS), and autoimmune destruction of cells. Low suspicion for leukemia, MDS is a possibility but considered slim. No symptoms of infection or bleeding, suggesting adequate cell function.   On her consultation with us  on 05/28/2024, labs revealed white count of 3300 with ANC of 1200, ALC of 1300, normal differential.  Morphology unremarkable.  Hemoglobin 12.5, platelet 113,000.  CMP unremarkable.  B12, folate, methylmalonic acid, serum copper , LDH were within normal limits.  ANA and rheumatoid factor were negative. Flow cytometry of peripheral blood was unremarkable.  BCR/ABL 1 testing was negative.   Chronic thrombocytopenia with a platelet count in the range of 100,000 to 140,000 since May 2020. No symptoms of bleeding such as  epistaxis or gum bleeding. Differential diagnosis includes autoimmune destruction of platelets and MDS. Platelet count is above the threshold for treatment with steroids, which would be considered if platelets fall below 30,000.   On our consultation with us  on  05/28/2024, platelet count was stable at 113,000.  Immature platelet fraction was increased at 9.5%, indicating adequate bone marrow response to thrombocytopenia.  I have reviewed the past medical history, past surgical history, social history and family history with the patient and they are unchanged from previous note.  ALLERGIES: She is allergic to penicillins and sulfa antibiotics.  MEDICATIONS:  Current Outpatient Medications  Medication Sig Dispense Refill   Calcium Carb-Cholecalciferol (CALCIUM 600 + D) 600-5 MG-MCG TABS Take 1 tablet by mouth daily.     Ergocalciferol 10 MCG (400 UNIT) TABS Take 1 tablet by mouth daily.     JUBBONTI 60 MG/ML SOSY injection Inject 60 mg into the skin every 6 (six) months.     levothyroxine (SYNTHROID) 75 MCG tablet Take 75 mcg by mouth daily before breakfast.     Lutein-Zeaxanthin 25-5 MG CAPS Take 1 tablet by mouth daily.     No current facility-administered medications for this visit.     REVIEW OF SYSTEMS:    Review of Systems - Oncology  All other pertinent systems were reviewed with the patient and are negative.  PHYSICAL EXAMINATION:    Onc Performance Status - 10/29/24 1041       ECOG Perf Status   ECOG Perf Status Fully active, able to carry on all pre-disease performance without restriction      KPS SCALE   KPS % SCORE Normal, no compliants, no evidence of disease           Vitals:   10/29/24 1030  BP: (!) 161/70  Pulse: 85  Resp: 18  Temp: 98.8 F (37.1 C)  SpO2: 100%    Filed Weights   10/29/24 1030  Weight: 102 lb (46.3 kg)     Physical Exam Constitutional:      General: She is not in acute distress.    Appearance: Normal appearance.  HENT:     Head: Normocephalic and atraumatic.  Cardiovascular:     Rate and Rhythm: Normal rate.  Pulmonary:     Effort: Pulmonary effort is normal. No respiratory distress.  Abdominal:     General: There is no distension.  Neurological:     General: No focal  deficit present.     Mental Status: She is alert and oriented to person, place, and time.  Psychiatric:        Mood and Affect: Mood normal.        Behavior: Behavior normal.      LABORATORY DATA:   I have reviewed the data as listed.  Results for orders placed or performed in visit on 10/29/24  CMP (Cancer Center only)  Result Value Ref Range   Sodium 137 135 - 145 mmol/L   Potassium 4.8 3.5 - 5.1 mmol/L   Chloride 101 98 - 111 mmol/L   CO2 29 22 - 32 mmol/L   Glucose, Bld 100 (H) 70 - 99 mg/dL   BUN 14 8 - 23 mg/dL   Creatinine 9.14 9.55 - 1.00 mg/dL   Calcium 9.7 8.9 - 89.6 mg/dL   Total Protein 7.0 6.5 - 8.1 g/dL   Albumin 4.3 3.5 - 5.0 g/dL   AST 21 15 - 41 U/L   ALT 11 0 - 44 U/L   Alkaline Phosphatase  79 38 - 126 U/L   Total Bilirubin 0.3 0.0 - 1.2 mg/dL   GFR, Estimated >39 >39 mL/min   Anion gap 8 5 - 15  Lactate dehydrogenase  Result Value Ref Range   LDH 236 (H) 105 - 235 U/L  CBC with Differential (Cancer Center Only)  Result Value Ref Range   WBC Count 3.0 (L) 4.0 - 10.5 K/uL   RBC 3.96 3.87 - 5.11 MIL/uL   Hemoglobin 12.2 12.0 - 15.0 g/dL   HCT 64.2 (L) 63.9 - 53.9 %   MCV 90.2 80.0 - 100.0 fL   MCH 30.8 26.0 - 34.0 pg   MCHC 34.2 30.0 - 36.0 g/dL   RDW 86.1 88.4 - 84.4 %   Platelet Count 88 (L) 150 - 400 K/uL   nRBC 0.0 0.0 - 0.2 %   Neutrophils Relative % 32 %   Neutro Abs 1.0 (L) 1.7 - 7.7 K/uL   Lymphocytes Relative 44 %   Lymphs Abs 1.3 0.7 - 4.0 K/uL   Monocytes Relative 23 %   Monocytes Absolute 0.7 0.1 - 1.0 K/uL   Eosinophils Relative 0 %   Eosinophils Absolute 0.0 0.0 - 0.5 K/uL   Basophils Relative 1 %   Basophils Absolute 0.0 0.0 - 0.1 K/uL   WBC Morphology MORPHOLOGY UNREMARKABLE    RBC Morphology MORPHOLOGY UNREMARKABLE    Smear Review See Note    Immature Granulocytes 0 %   Abs Immature Granulocytes 0.01 0.00 - 0.07 K/uL     RADIOGRAPHIC STUDIES:  No recent pertinent imaging studies available to review.  Orders Placed  This Encounter  Procedures   CBC with Differential (Cancer Center Only)    Standing Status:   Future    Expiration Date:   10/29/2025   CMP (Cancer Center only)    Standing Status:   Future    Expiration Date:   10/29/2025   Lactate dehydrogenase    Standing Status:   Future    Expiration Date:   10/29/2025   Immature Platelet Fraction    Standing Status:   Future    Expiration Date:   10/29/2025     Future Appointments  Date Time Provider Department Center  01/28/2025  9:15 AM [facility] None  01/28/2025  9:45 AM Lincoln Kleiner, Chinita, MD [facility] None    This document was completed utilizing speech recognition software. Grammatical errors, random word insertions, pronoun errors, and incomplete sentences are an occasional consequence of this system due to software limitations, ambient noise, and hardware issues. Any formal questions or concerns about the content, text or information contained within the body of this dictation should be directly addressed to the provider for clarification.

## 2024-10-29 NOTE — Assessment & Plan Note (Signed)
 Chronic thrombocytopenia with a platelet count in the range of 100,000 to 140,000 since May 2020. No symptoms of bleeding such as epistaxis or gum bleeding. Differential diagnosis includes autoimmune destruction of platelets and MDS. Platelet count is above the threshold for treatment with steroids, which would be considered if platelets fall below 30,000.   On our consultation with us  on 05/28/2024, platelet count was stable at 113,000.  Immature platelet fraction was increased at 9.5%, indicating adequate bone marrow response to thrombocytopenia.

## 2025-01-28 ENCOUNTER — Inpatient Hospital Stay: Attending: Oncology

## 2025-01-28 ENCOUNTER — Inpatient Hospital Stay: Admitting: Oncology
